# Patient Record
Sex: Male | Born: 1935 | Race: White | Hispanic: No | Marital: Married | Smoking: Never smoker
Health system: Southern US, Community
[De-identification: ages and names within clinical notes are randomized; demographics above are authoritative.]

## PROBLEM LIST (undated history)

## (undated) DIAGNOSIS — I639 Cerebral infarction, unspecified: Secondary | ICD-10-CM

## (undated) DIAGNOSIS — K219 Gastro-esophageal reflux disease without esophagitis: Secondary | ICD-10-CM

## (undated) DIAGNOSIS — E785 Hyperlipidemia, unspecified: Secondary | ICD-10-CM

## (undated) DIAGNOSIS — I2510 Atherosclerotic heart disease of native coronary artery without angina pectoris: Secondary | ICD-10-CM

## (undated) DIAGNOSIS — E119 Type 2 diabetes mellitus without complications: Secondary | ICD-10-CM

## (undated) DIAGNOSIS — G459 Transient cerebral ischemic attack, unspecified: Secondary | ICD-10-CM

## (undated) DIAGNOSIS — I219 Acute myocardial infarction, unspecified: Secondary | ICD-10-CM

## (undated) DIAGNOSIS — N419 Inflammatory disease of prostate, unspecified: Secondary | ICD-10-CM

## (undated) DIAGNOSIS — R471 Dysarthria and anarthria: Secondary | ICD-10-CM

## (undated) DIAGNOSIS — M2550 Pain in unspecified joint: Secondary | ICD-10-CM

## (undated) DIAGNOSIS — C4491 Basal cell carcinoma of skin, unspecified: Secondary | ICD-10-CM

## (undated) DIAGNOSIS — I10 Essential (primary) hypertension: Secondary | ICD-10-CM

## (undated) DIAGNOSIS — M4800 Spinal stenosis, site unspecified: Secondary | ICD-10-CM

## (undated) DIAGNOSIS — N529 Male erectile dysfunction, unspecified: Secondary | ICD-10-CM

## (undated) HISTORY — DX: Transient cerebral ischemic attack, unspecified: G45.9

## (undated) HISTORY — DX: Essential (primary) hypertension: I10

## (undated) HISTORY — DX: Basal cell carcinoma of skin, unspecified: C44.91

## (undated) HISTORY — DX: Inflammatory disease of prostate, unspecified: N41.9

## (undated) HISTORY — PX: LUMBAR LAMINECTOMY: SHX95

## (undated) HISTORY — DX: Acute myocardial infarction, unspecified: I21.9

## (undated) HISTORY — DX: Type 2 diabetes mellitus without complications: E11.9

## (undated) HISTORY — DX: Gastro-esophageal reflux disease without esophagitis: K21.9

## (undated) HISTORY — DX: Atherosclerotic heart disease of native coronary artery without angina pectoris: I25.10

## (undated) HISTORY — DX: Spinal stenosis, site unspecified: M48.00

## (undated) HISTORY — PX: KNEE SURGERY: SHX244

## (undated) HISTORY — DX: Pain in unspecified joint: M25.50

## (undated) HISTORY — DX: Hyperlipidemia, unspecified: E78.5

## (undated) HISTORY — DX: Male erectile dysfunction, unspecified: N52.9

## (undated) HISTORY — DX: Cerebral infarction, unspecified: I63.9

---

## 2005-09-12 HISTORY — PX: CORONARY ANGIOPLASTY WITH STENT PLACEMENT: SHX49

## 2011-01-11 DIAGNOSIS — I219 Acute myocardial infarction, unspecified: Secondary | ICD-10-CM

## 2011-01-11 HISTORY — DX: Acute myocardial infarction, unspecified: I21.9

## 2012-12-04 ENCOUNTER — Ambulatory Visit: Payer: Self-pay | Admitting: Oncology

## 2012-12-04 LAB — CBC CANCER CENTER
Basophil #: 0.1 x10 3/mm (ref 0.0–0.1)
Basophil %: 1.7 %
Eosinophil #: 0.1 x10 3/mm (ref 0.0–0.7)
HGB: 12.1 g/dL — ABNORMAL LOW (ref 13.0–18.0)
Lymphocyte #: 1.4 x10 3/mm (ref 1.0–3.6)
Lymphocyte %: 16.4 %
MCH: 31.1 pg (ref 26.0–34.0)
MCHC: 35.7 g/dL (ref 32.0–36.0)
MCV: 87 fL (ref 80–100)
Monocyte #: 0.7 x10 3/mm (ref 0.2–1.0)
Monocyte %: 8 %
Neutrophil #: 6.1 x10 3/mm (ref 1.4–6.5)
Neutrophil %: 72.7 %
RDW: 14.4 % (ref 11.5–14.5)

## 2012-12-04 LAB — LACTATE DEHYDROGENASE: LDH: 175 U/L (ref 85–241)

## 2012-12-11 ENCOUNTER — Ambulatory Visit: Payer: Self-pay | Admitting: Oncology

## 2013-06-24 ENCOUNTER — Inpatient Hospital Stay: Payer: Self-pay | Admitting: Internal Medicine

## 2013-06-24 LAB — BASIC METABOLIC PANEL
Calcium, Total: 9.9 mg/dL (ref 8.5–10.1)
Co2: 24 mmol/L (ref 21–32)
Creatinine: 1.45 mg/dL — ABNORMAL HIGH (ref 0.60–1.30)
EGFR (African American): 52 — ABNORMAL LOW
EGFR (Non-African Amer.): 45 — ABNORMAL LOW
Glucose: 226 mg/dL — ABNORMAL HIGH (ref 65–99)
Potassium: 3.6 mmol/L (ref 3.5–5.1)
Sodium: 141 mmol/L (ref 136–145)

## 2013-06-24 LAB — CBC WITH DIFFERENTIAL/PLATELET
Basophil #: 0.1 10*3/uL (ref 0.0–0.1)
Basophil %: 0.6 %
Eosinophil %: 1.5 %
HCT: 38.2 % — ABNORMAL LOW (ref 40.0–52.0)
HGB: 13.6 g/dL (ref 13.0–18.0)
Lymphocyte %: 13.3 %
MCHC: 35.5 g/dL (ref 32.0–36.0)
MCV: 88 fL (ref 80–100)
Monocyte #: 1.1 x10 3/mm — ABNORMAL HIGH (ref 0.2–1.0)
Monocyte %: 8.7 %
Neutrophil #: 9.8 10*3/uL — ABNORMAL HIGH (ref 1.4–6.5)
Neutrophil %: 75.9 %
RDW: 14.7 % — ABNORMAL HIGH (ref 11.5–14.5)
WBC: 12.9 10*3/uL — ABNORMAL HIGH (ref 3.8–10.6)

## 2013-06-24 LAB — PROTIME-INR: Prothrombin Time: 15.7 secs — ABNORMAL HIGH (ref 11.5–14.7)

## 2013-06-24 LAB — DIGOXIN LEVEL: Digoxin: 0.1 ng/mL

## 2013-06-25 LAB — URINALYSIS, COMPLETE
Hyaline Cast: 3
Ketone: NEGATIVE
Leukocyte Esterase: NEGATIVE
Nitrite: NEGATIVE
Ph: 5 (ref 4.5–8.0)
Specific Gravity: 1.013 (ref 1.003–1.030)
Squamous Epithelial: NONE SEEN
WBC UR: <1 /HPF (ref 0–5)

## 2013-09-27 ENCOUNTER — Ambulatory Visit: Payer: Self-pay | Admitting: Nurse Practitioner

## 2013-10-17 ENCOUNTER — Encounter: Payer: Self-pay | Admitting: Podiatry

## 2013-10-25 ENCOUNTER — Encounter: Payer: Self-pay | Admitting: Podiatry

## 2013-10-25 ENCOUNTER — Ambulatory Visit (INDEPENDENT_AMBULATORY_CARE_PROVIDER_SITE_OTHER): Payer: Medicare Other | Admitting: Podiatry

## 2013-10-25 VITALS — BP 139/65 | HR 61 | Resp 16 | Ht 73.0 in | Wt 180.0 lb

## 2013-10-25 DIAGNOSIS — B351 Tinea unguium: Secondary | ICD-10-CM

## 2013-10-25 DIAGNOSIS — M2040 Other hammer toe(s) (acquired), unspecified foot: Secondary | ICD-10-CM

## 2013-10-25 DIAGNOSIS — M79609 Pain in unspecified limb: Secondary | ICD-10-CM

## 2013-10-25 NOTE — Progress Notes (Signed)
Subjective:     Patient ID: Robert Rojas, male   DOB: [birth year], 81 y.o.   MRN: [id number]  HPI patient presents with caregiver stating he has had strokes and he is developed hammertoes and painful nailbeds due to the position of his toes in general. States this has been getting worse over the last couple years and he cannot cut them   Review of Systems  All other systems reviewed and are negative.       Objective:   Physical Exam  Nursing note and vitals reviewed. Constitutional: He is oriented to person, place, and time.  Neurological: He is oriented to person, place, and time.  Skin: Skin is dry.   I noted that the patient's pulses are reduced +1/4 both DP and PT but I didn't note normal Fill time and warmness to his feet. He's got digital contracture lesser digits right over left foot with incurvation of the nailbeds but no keratotic lesions or indications of ulcerations. Neurological was diminished range of motion is diminished of the subtalar and midtarsal joint with contracture of the right foot secondary to stroke     Assessment:     Painful mycotic nail infection secondary to digital position with hammertoe deformity secondary to stroke    Plan:     H&P and education rendered to patient. Debridement nailbeds 1-5 both feet which will be done routinely and he'll be seen back prior if any redness or irritation were to occur within his toes her feet

## 2013-10-25 NOTE — Progress Notes (Signed)
   Subjective:    Patient ID: Robert Rojas, male    DOB: [birth year], 80 y.o.   MRN: [id number]  HPI Comments: He needs his toenails trimmed and his toes are curling under his right foot. My toes and toenails do not hurt. Pt was trimming his toenails but not able to trim them now.      Review of Systems  Genitourinary:       Frequency  Musculoskeletal:       Difficulty walking  All other systems reviewed and are negative.       Objective:   Physical Exam        Assessment & Plan:

## 2013-12-04 ENCOUNTER — Ambulatory Visit: Payer: Self-pay | Admitting: Nurse Practitioner

## 2014-01-24 ENCOUNTER — Ambulatory Visit: Payer: Medicare Other | Admitting: Podiatry

## 2014-01-28 ENCOUNTER — Encounter: Payer: Self-pay | Admitting: Podiatry

## 2014-01-28 ENCOUNTER — Ambulatory Visit (INDEPENDENT_AMBULATORY_CARE_PROVIDER_SITE_OTHER): Payer: Medicare Other | Admitting: Podiatry

## 2014-01-28 VITALS — BP 147/72 | HR 82 | Resp 16

## 2014-01-28 DIAGNOSIS — M79609 Pain in unspecified limb: Secondary | ICD-10-CM

## 2014-01-28 DIAGNOSIS — B351 Tinea unguium: Secondary | ICD-10-CM

## 2014-01-28 NOTE — Progress Notes (Signed)
Subjective:     Patient ID: Robert Rojas, male   DOB: 15-Dec-1929, 80 y.o.   MRN: [id number]  HPI patient presents with thick nailbeds 1-5 both feet better yellow and he cannot take care of them himself and they're painful  Review of Systems     Objective:   Physical Exam Thick brittle nailbeds 1-5 both feet with ingrown corners and pain    Assessment:     Mycotic nail infection with pain 1-5 both feet nails    Plan:     Debridement painful nailbeds 1-5 both feet with no iatrogenic bleeding noted

## 2014-04-29 ENCOUNTER — Ambulatory Visit: Payer: Medicare Other

## 2015-01-02 NOTE — H&P (Signed)
PATIENT NAME:  Robert Rojas, Robert Rojas MR#:  [id number] DATE OF BIRTH:  09-26-1929  PRIMARY CARE PHYSICIAN:  Dorisann Frames, MD.  CHIEF COMPLAINT: Slurred speech and weakness.  HISTORY OF PRESENT ILLNESS: This is an 81 year old male who presents to the hospital as a direct admission from Dr. Bailey Mech office due to slurred speech and weakness. The patient apparently has a previous history of CVA and normally had some mild amount of slurred speech  but has significantly improved. Over the past 2 days, his speech has been significantly more slurred and he has had generalized weakness, when his wife says that she has not been able to get him out of bed since this morning. She also has to help him over the weekend with going to the bathroom when normally he is able to get around with a cane.   He went to see his primary care physician who then referred him to the hospital for further evaluation. The patient denies any headache, any numbness, tingling, any focal weakness anywhere, any nausea, vomiting, abdominal pain, chest pain, shortness of breath or any other associated symptoms.   REVIEW OF SYSTEMS:  CONSTITUTIONAL: No documented fever. No weight gain or weight loss.  EYES: No blurred or double vision.  ENT: No tinnitus. No postnasal drip. No redness of the oropharynx.  RESPIRATORY: No cough. No wheeze, no hemoptysis, no dyspnea.  CARDIOVASCULAR: No chest pain, no orthopnea, no palpitations or syncope.  GASTROINTESTINAL: No nausea. No vomiting. No diarrhea. No abdominal pain. No melena or hematochezia.  GENITOURINARY: No dysuria. No hematuria.  ENDOCRINE: No polyuria or nocturia. No heat or cold intolerance.  HEMATOLOGIC: No anemia. No bruising. No bleeding.  INTEGUMENTARY: No rashes. No lesions.  MUSCULOSKELETAL: No arthritis. No swelling. No gout.  NEUROLOGIC: No numbness or tingling. No ataxia. No seizure-type activity.  PSYCHIATRIC: No anxiety. No insomnia. No ADD.   PAST MEDICAL HISTORY:   Consistent with history of chronic atrial fibrillation, hypertension, hyperlipidemia, diabetes, history of previous CVA.   ALLERGIES: No known drug allergies.   SOCIAL HISTORY: No smoking. No alcohol abuse. No illicit drug abuse. Lives at home with his wife.   FAMILY HISTORY: Both mother and father are deceased. Mother had diabetes. She died from old age. Father died from complications of heart disease.   CURRENT MEDICATIONS: Potassium 20 mEq daily, Cardizem CD 240 mg daily, lisinopril 2.5 mg daily, Coreg 6.25 mg b.i.d., Lasix 40 mg b.i.d., gemfibrozil 600 mg b.i.d., Lasix 40 mg  b.i.d., digoxin 250 mcg daily, aspirin 81 mg daily, Pravachol 40 mg daily, Pradaxa 150 mg b.i.d.,  Januvia 50 mg daily, and Lantus 100 units at bedtime.   PHYSICAL EXAMINATION:  VITAL SIGNS: Temperature 97.5, pulse 56, respirations 20, blood pressure 146/66, sats 96% on room air.  GENERAL: A pleasant -appearing male in no apparent distress.  HEENT: Atraumatic, normocephalic. Extraocular muscles are intact. Pupils equal, reactive to light. Sclerae anicteric. No conjunctival injection. No oropharyngeal erythema.  NECK: Supple. There is no jugular venous distention. No bruits, no lymphadenopathy, no thyromegaly.  HEART: Irregular. No murmurs. No rubs. No clicks.  LUNGS: Clear to auscultation bilaterally. No rales. No rhonchi, no wheezes.  ABDOMEN: Soft, flat, nontender, nondistended. Has good bowel sounds. No hepatosplenomegaly appreciated.  EXTREMITIES: No evidence of any cyanosis, clubbing, or peripheral edema. Has +2 pedal and radial pulses bilaterally.  NEUROLOGIC: Alert, awake, and oriented x3, globally weak; otherwise, no other focal motor or sensory deficits appreciated bilaterally. Babinski's are downgoing bilaterally. Finger-to-nose test is normal. No ataxia noted.  LABORATORY AND RADIOLOGICAL DATA: Still pending.   ASSESSMENT AND PLAN: This is an 81 year old male with a history of chronic atrial  fibrillation, history of previous cerebrovascular accident, hypertension, hyperlipidemia, diabetes, who presents to the hospital with slurred speech and generalized weakness, ongoing for the past two days.   1.  Slurred speech and worsening weakness. The patient's symptoms are suspicious for a possible acute cerebrovascular accident, but these symptoms started over 2 days ago; therefore, he has missed the window for thrombolytics. I will get a CT head STAT now. If it is negative, consider getting a MRI of the brain. Will get a carotid duplex and a 2-dimensional echocardiogram.  2.  Continue his Pradaxa. Will get a physical therapy and a speech evaluation. I will follow q.4 hour neuro checks and get a neurology consult.  3.  History of chronic atrial fibrillation. The patient is currently rate controlled. I will continue his digoxin, Cardizem, and Coreg and continue his Pradaxa for now.  4.  Hypertension, presently hemodynamically stable. Continue Coreg. Continue lisinopril.  5.  Diabetes. The patient will be maintained on his Januvia and sliding-scale insulin for now. Hold Lantus at this time.  6.  Hyperlipidemia. Continue Pravachol.  7.  History of congestive heart failure. The patient clinically does not appear to be in congestive heart failure. I will hold his Lasix for now, continue Coreg and continue lisinopril for now.   CODE STATUS: FULL CODE.   TIME SPENT ON ADMISSION: 50 minutes.   The patient will be transferred over to Dr. Bailey Mech service.    ____________________________ Belia Heman. Verdell Carmine, MD vjs:np D: 06/24/2013 15:32:00 ET T: 06/24/2013 15:44:09 ET JOB#: [id number]  cc: Belia Heman. Verdell Carmine, MD, <Dictator> Henreitta Leber MD ELECTRONICALLY SIGNED 06/25/2013 11:17

## 2015-01-02 NOTE — Discharge Summary (Signed)
PATIENT NAME:  Robert Rojas, Robert Rojas MR#:  [id number] DATE OF BIRTH:  1930-09-06  DATE OF ADMISSION:  06/24/2013 DATE OF DISCHARGE:  06/27/2013   ADMITTING PHYSICIAN: Vivek J. Verdell Carmine, MD   DISCHARGE DIAGNOSES:  1. Acute right paraventricular and lenticular nucleus stroke.  2. History of cerebrovascular disease.  3. History of diastolic congestive heart failure.  4. Atrial fibrillation.  5. Diabetes mellitus. 6. Hypertension.   CONSULTATION: Neurology, Doneta Public. Melrose Nakayama, MD.  PROCEDURES: None.   IMAGING:  1. Carotid Doppler ultrasound.  2. CT scan of the brain.  3. MRI of the brain. MRI of the brain confirmed stroke with infarct in above-mentioned areas.   DISCHARGE MEDICATIONS:  1. Aspirin 81 mg daily.  2. Carvedilol 6.25 mg b.i.d. 3. Diltiazem 240 mg daily.  4. Gemfibrozil 600 mg b.i.d.  5. Potassium 20 mEq daily.  6. Pradaxa 150 mg b.i.d. 7. Pravastatin 40 mg at bedtime.  8. Lisinopril 2.5 mg daily.  9. Furosemide 40 mg b.i.d.  10. Januvia 50 mg once a day.  11. Lantus 20 units subcutaneous at bedtime.  12. Digoxin 250 mcg daily.   HOSPITAL COURSE: This gentleman was admitted from his 13 office, where he presented with worsening slurred speech and inability to rise out of his wheelchair, concerning for an acute stroke. He was admitted through the Emergency Room. CT scan of the brain was initially negative for an acute stroke; however, MRI performed the following day confirmed acute right periventricular infarct. A neurology consultation was placed with Dr. Melrose Nakayama, whose clinical impression concurred with that. Due to the fact that he was already on 2 antiplatelet agents, there was no additional therapy that could be added. The patient'Robert Rojas speech gradually improved during his stay. He was evaluated by speech therapy, and his diet was modified appropriately. The patient'Robert Rojas carotid ultrasound was negative for any significant obstruction. He underwent physical therapy with rehab and  was scheduled to be discharged to a skilled nursing facility.   DISCHARGE INSTRUCTIONS: 1. Diet: Low sodium, low fat, ADA diet.  2. Activity: Continue physical therapy as tolerated.  3. Follow up in 2 to 4 weeks after discharge from skilled nursing facility following rehabilitation.  ____________________________ Venetia Maxon Elijio Miles, MD sat:lb D: 06/27/2013 12:53:16 ET T: 06/27/2013 13:36:00 ET JOB#: [id number]  cc: Sheikh A. Elijio Miles, MD, <Dictator> Veverly Fells MD ELECTRONICALLY SIGNED 07/05/2013 13:48

## 2015-01-02 NOTE — Consult Note (Signed)
Referring Physician:  Veverly Fells   Primary Care Physician:  Selinda Michaels, [address], [place], [place] [address], [place]  Reason for Consult: Admit Date: 24-Jun-2013  Chief Complaint: slurred speech  Reason for Consult: slurred speech   History of Present Illness: History of Present Illness:   81 year old man with history of stroke with subsequent slurred speech, presents from clinic because of worsening slurred speech and concern for new stroke.  Normally patients dysarthria is mild.  However, over the past two days or so his speech has become more and more slurred to the point that it has become difficult to understand him.  Also some complaint of mild generalized weakness as well.  No new focal weakness in arm or leg.  No facial droop.  Symptoms constant.  Moderate severity.  Nothing makes them better or worse.   MEDICAL HISTORY: atrial fibrillation, of previous CVA.  SURGICAL HISTORY:replacement. HISTORY: smoking.alcohol abuse. illicit drug abuse. at home with his wife.  HISTORY: mother and father are deceased. had diabetes. She died from old age. died from complications of heart disease.  MEDICATIONS: 20 mEq daily, CD 240 mg daily,2.5 mg daily 6.25 mg b.i.d,40 mg b.i.d, 600 mg b.i.d,40 mg  b.i.d 250 mcg daily 81 mg daily 40 mg daily 150 mg b.i.d.  50 mg daily Lantus 100 units at bedtime.   ALLERGIES: No known drug allergies.   ROS:  General weakness   HEENT no complaints   Lungs no complaints   Cardiac no complaints   GI no complaints   GU no complaints   Musculoskeletal no complaints   Extremities no complaints   Skin no complaints   Endocrine no complaints   Psych no complaints   Past Medical/Surgical Hx:  Hear failure:   Diabetes Type 2:   Cardiac Stents:   Irregular heart beat: made aware after his heart attack  TIA's: several over the years  CVA: 2012  Myocardial Infarction: History of 2 heart attacks,  1989 and 2011  Back surgery:   Left knee replacement:   Home Medications: Medication Instructions Last Modified Date/Time  Aspir 81 81 mg oral tablet 1 tab(s) orally once a day 13-Oct-14 16:34  carvedilol 6.25 mg oral tablet 1 tab(s) orally 2 times a day 13-Oct-14 16:34  diltiazem 24 hour extended release 240 mg/24 hours oral capsule, extended release 1 cap(s) orally once a day 13-Oct-14 16:34  gemfibrozil 600 mg oral tablet 1 tab(s) orally 2 times a day 13-Oct-14 16:34  Janumet 1000 mg-50 mg oral tablet 1 tab(s) orally 2 times a day 13-Oct-14 16:34  Klor-Con M20 20 mEq oral tablet, extended release 1 tab(s) orally once a day 13-Oct-14 16:34  Pradaxa 150 mg oral capsule 1 cap(s) orally 2 times a day 13-Oct-14 16:34  pravastatin 40 mg oral tablet 1 tab(s) orally once a day (at bedtime) 13-Oct-14 16:34  lisinopril 2.5 mg oral tablet 1 tab(s) orally once a day 13-Oct-14 16:34  furosemide 40 mg oral tablet 40 milligram(s) orally 2 times a day 13-Oct-14 16:34  Januvia 50 mg oral tablet 1 tab(s) orally once a day 13-Oct-14 16:34  Lantus 100 units/mL subcutaneous solution 20  subcutaneous once a day (at bedtime) 13-Oct-14 16:34   [facility] Current Meds:  Acetaminophen * tablet, ( Tylenol (325 mg) tablet)  650 mg Oral q4h PRN for pain or temp. greater than 100.4  - Indication: Pain/Fever  Diltiazem ER capsule (24HR),  ( Cardizem CD)  240 mg Oral daily  - Indication: Angina/ Hypertension  Instructions:  - DO NOT CRUSH  Ondansetron injection, ( Zofran injection )  4 mg, IV push, q4h PRN for Nausea/Vomiting  Indication: Nausea/ Vomiting  Carvedilol tablet,  ( Coreg)  6.25 mg Oral bid  - Indication: CHF/ Hypertension  Digoxin tablet, ( Lanoxin)  0.25 mg Oral daily  - Indication: Heart Failure/ Atrial Fibrillation and Flutter/ Paroxysmal Atrial Tachycardia  Lisinopril tablet, ( Zestril)  2.5 mg Oral daily  - Indication: Hypertension/ CHF  Dabigatran capsule,  ( pRADAxa capsule)  150 mg Oral  bid  - Indication: stroke prevention in non-valvular a-fib, Monitor Anticoags per hospital protocol  Instructions:  DO NOT CRUSH  Insulin SS -Novolog injection, Subcutaneous, FSBS before meals and at bedtime  give no insulin if FSBS 0 - 150     2 unit(s) if FSBS 151 - 200     4 unit(s) if FSBS 201 - 250     6 unit(s) if FSBS 251 - 300     8 unit(s) if FSBS 301 - 350     10 unit(s) if FSBS 351 - 400  Call MD if Blood Glucose greater than 400, [Waste Code: Black]  Pravastatin tablet, ( Pravachol)  40 mg Oral at bedtime  - Indication: Hypercholesterolemia  sitaGLIPtin tablet,  ( Januvia)  50 mg Oral daily  - Indication: antidiabetic agent  Influenza Virus Quadrivalent Vaccine injection, 0.5 ml, Intramuscular, once  Indication: provide Active Immunity to Influenza Strains contained in Vaccine, ***The patient must have a temperature of 100.5 or less, anything greater the patient needs to be afebrile x 24 hours before administration***, **Latex Free**  Nursing Saline Flush, 3 to 6 ml, IV push, Q1M PRN for IV Maintenance  Nursing Saline Flush, 3 to 6 ml, IV push, Q1M PRN for IV Maintenance  Allergies:  No Known Allergies:   Vital Signs: **Vital Signs.:   14-Oct-14 05:57  Vital Signs Type Routine  Temperature Temperature (F) 98.5  Celsius 36.9  Temperature Source oral  Pulse Pulse 79  Respirations Respirations 19  Systolic BP Systolic BP 854  Diastolic BP (mmHg) Diastolic BP (mmHg) 66  Mean BP 87  Pulse Ox % Pulse Ox % 96  Pulse Ox Activity Level  At rest  Oxygen Delivery Room Air/ 21 %   EXAM: PHYSICAL EXAMINATION  GENERAL: Pleasant and interactive.  NAD.  Normocephalic and atraumatic.  EYES: Funduscopic exam shows normal disc size, appearance and C/D ratio without clear evidence of papilledema.  CARDIOVASCULAR: S1 and S2 sounds are within normal limits, without murmurs, gallops, or rubs.  MUSCULOSKELETAL: Bulk - Normal Tone - Normal Pronator Drift - Absent  bilaterally. Ambulation - Deferred, on fall precautions over concern for stroke.  R/L 5/4    Shoulder abduction (deltoid/supraspinatus, axillary/suprascapular n, C5) 5/4    Elbow flexion (biceps brachii, musculoskeletal n, C5-6) 5/4    Elbow extension (triceps, radial n, C7) 5/4    Finger adduction (interossei, ulnar n, T1)   5/4    Hip flexion (iliopsoas, L1/L2) 5/4    Knee flexion (hamstrings, sciatic n, L5/S1) 5/4    Knee extension (quadriceps, femoral n, L3/4) 5/4    Ankle dorsiflexion (tibialis anterior, deep fibular n, L4/5) 5/4    Ankle plantarflexion (gastroc, tibial n, S1)  NEUROLOGICAL: MENTAL STATUS: Patient is oriented to person, place and time.  Recent and remote memory are intact.  Attention span and concentration are intact.  Naming, repetition, comprehension and expressive speech are within  normal limits.  Moderate to severe dysarthria is noted, however no aphasia.  Patient's fund of knowledge is within normal limits for educational level.  CRANIAL NERVES: Normal    CN II (normal visual acuity and visual fields) Normal    CN III, IV, VI (extraocular muscles are intact) Normal    CN V (facial sensation is intact bilaterally) Normal    CN VII (facial strength is intact bilaterally) Normal    CN VIII (hearing is intact bilaterally) Abnormal    CN IX/X (palate elevates midline, dysarthric speech) Normal    CN XI (shoulder shrug strength is normal and symmetric) Normal    CN XII (tongue protrudes midline)   SENSATION: Intact to pain and temp bilaterally (spinothalamic tracts) Intact to position and vibration bilaterally (dorsal columns)   REFLEXES: R/L 2+/2+    Biceps 2+/2+    Brachioradialis   2+/2+    Patellar 2+/2+    Achilles   COORDINATION/CEREBELLAR: Finger to nose testing is within normal limits.  Lab Results:  LabObservation:  13-Oct-14 21:26   OBSERVATION Reason for Test  TDMs:  13-Oct-14 16:32   Digoxin, Serum 0.10 (Therapeutic range for digoxin  in patients with atrial fibrillation: 0.8 - 2.0 ng/mL. In patients with congestive heart failure a therapeutic range of 0.5 - 0.8 ng/mL is suggested as higher levels are associated with an increased risk of toxicity without clear evidence of enhanced efficacy. Digoxin toxicity is commonly associated with serum levels > 2.0 ng/mL but may occur with lower levels, including those in the therapeutic range. Blood samples should be obtained 6-8 hours after administration to assure a reasonable volume of distribution.)  Routine Chem:  13-Oct-14 16:32   Glucose, Serum  226  BUN  31  Creatinine (comp)  1.45  Sodium, Serum 141  Potassium, Serum 3.6  Chloride, Serum 105  CO2, Serum 24  Calcium (Total), Serum 9.9  Anion Gap 12  Osmolality (calc) 295  eGFR (African American)  52  eGFR (Non-African American)  45 (eGFR values <43m/min/1.73 m2 may be an indication of chronic kidney disease (CKD). Calculated eGFR is useful in patients with stable renal function. The eGFR calculation will not be reliable in acutely ill patients when serum creatinine is changing rapidly. It is not useful in  patients on dialysis. The eGFR calculation may not be applicable to patients at the low and high extremes of body sizes, pregnant women, and vegetarians.)  Routine UA:  14-Oct-14 02:28   Color (UA) Yellow  Clarity (UA) Clear  Glucose (UA) >=500  Bilirubin (UA) Negative  Ketones (UA) Negative  Specific Gravity (UA) 1.013  Blood (UA) Negative  pH (UA) 5.0  Protein (UA) 30 mg/dL  Nitrite (UA) Negative  Leukocyte Esterase (UA) Negative (Result(s) reported on 25 Jun 2013 at 03:26AM.)  RBC (UA) 1 /HPF  WBC (UA) <1 /HPF  Bacteria (UA) NONE SEEN  Epithelial Cells (UA) NONE SEEN  Hyaline Cast (UA) 3 /LPF (Result(s) reported on 25 Jun 2013 at 03:26AM.)  Routine Coag:  13-Oct-14 16:32   Prothrombin  15.7  INR 1.2 (INR reference interval applies to patients on anticoagulant therapy. A single INR  therapeutic range for coumarins is not optimal for all indications; however, the suggested range for most indications is 2.0 - 3.0. Exceptions to the INR Reference Range may include: Prosthetic heart valves, acute myocardial infarction, prevention of myocardial infarction, and combinations of aspirin and anticoagulant. The need for a higher or lower target INR must be assessed individually. Reference: The Pharmacology  and Management of the Vitamin K  antagonists: the seventh ACCP Conference on Antithrombotic and Thrombolytic Therapy. OIBBC.4888 Sept:126 (3suppl): [id number]. A HCT value >55% may artifactually increase the PT.  In one study,  the increase was an average of 25%. Reference:  "Effect on Routine and Special Coagulation Testing Values of Citrate Anticoagulant Adjustment in Patients with High HCT Values." American Journal of Clinical Pathology 2006;126:400-405.)  Routine Hem:  13-Oct-14 16:32   WBC (CBC)  12.9  RBC (CBC)  4.35  Hemoglobin (CBC) 13.6  Hematocrit (CBC)  38.2  Platelet Count (CBC) 238  MCV 88  MCH 31.2  MCHC 35.5  RDW  14.7  Neutrophil % 75.9  Lymphocyte % 13.3  Monocyte % 8.7  Eosinophil % 1.5  Basophil % 0.6  Neutrophil #  9.8  Lymphocyte # 1.7  Monocyte #  1.1  Eosinophil # 0.2  Basophil # 0.1 (Result(s) reported on 24 Jun 2013 at 05:07PM.)   Radiology Results: [place]:    13-Oct-14 17:34, US Carotid Doppler Bilateral  US Carotid Doppler Bilateral   REASON FOR EXAM:    Acute CVA/TIA  COMMENTS:       PROCEDURE: [place]  - US CAROTID DOPPLER BILATERAL  - Jun 24 2013  5:34PM     RESULT: History: CVA.     Comparison study: Head CT February 22, 2013.    Findings: Bilateral carotid color duplex Doppler revealsmild plaque both   carotid bifurcations. Peak systolic flow velocity ratio on the right is   1.1 , on the left 1.0. No hemodynamically significant stenosis.   Vertebrals are patent with antegrade flow.    IMPRESSION:  No hemodynamically significant  stenosis.    Verified By: Osa Craver, M.D., MD  CT:    13-Oct-14 16:54, CT Head Without Contrast  CT Head Without Contrast   REASON FOR EXAM:    AMS/Slurred Speech  COMMENTS:       PROCEDURE: CT  - CT HEAD WITHOUT CONTRAST  - Jun 24 2013  4:54PM     RESULT: History: Slurred speech.    Comparison Study: No prior.    Findings: Standard nonenhanced CT obtained. No mass or hydrocephalus.   White matter changes noted consistent with chronic ischemia. Old lacunar   infarction noted in the basal ganglia. Orbits are unremarkable. Mastoids   and paranasal sinuses are clear.    IMPRESSION:  Chronic ischemic change.    Verified By: Osa Craver, M.D., MD   Impression/Recommendations: Recommendations:   81 year old man with history of stroke with subsequent slurred speech, presents from clinic because of worsening slurred speech and concern for new stroke.  recently spoke with patient in clinic, dysarthria significantly worse, suspect acute CVA.  Rest of neuro exam unchanged per patient and wife.  Current HCT negative.  Patient needs Brain MRI if medically tolerated and full stroke workup.  Carotid ultrasound negative.  Continue his outpatient regimen of pradaxa and baby aspirin given likely ischemic event despite this aggressive regimen.  Allow permissive HTN up to SBP 220.  If unable to get Brain MRi for any reason, then repeat HCT in 48 hours from initial, so likely Wedns afternoon, but once again only if cannot get MRI.   HTNpradaxa and ASAultrasound - wnl.to rule out afib, patient known to have "irregular heart beat" (? afib)to rule out intracardiac thrombus.therapy consult.against smoking. have reviewed the results of the most recent imaging studies, tests and labs as outlined above and answered all related questions.  and  coordinated plan of care with hospitalist.       Electronic Signatures: Anabel Bene (MD)  (Signed 15-Oct-14 01:16)  Authored: REFERRING  PHYSICIAN, Primary Care Physician, Consult, History of Present Illness, Review of Systems, PAST MEDICAL/SURGICAL HISTORY, HOME MEDICATIONS, Current Medications, ALLERGIES, NURSING VITAL SIGNS, Physical Exam-, LAB RESULTS, RADIOLOGY RESULTS, Recommendations   Last Updated: 15-Oct-14 01:16 by Anabel Bene (MD)

## 2015-03-25 ENCOUNTER — Encounter: Payer: Self-pay | Admitting: *Deleted

## 2015-03-30 ENCOUNTER — Telehealth: Payer: Self-pay | Admitting: Urology

## 2015-03-30 NOTE — Telephone Encounter (Signed)
FY!

## 2015-03-31 ENCOUNTER — Ambulatory Visit: Payer: Self-pay | Admitting: Urology

## 2015-04-28 ENCOUNTER — Encounter: Payer: Self-pay | Admitting: Urology

## 2015-04-28 ENCOUNTER — Ambulatory Visit (INDEPENDENT_AMBULATORY_CARE_PROVIDER_SITE_OTHER): Payer: Medicare Other | Admitting: Urology

## 2015-04-28 VITALS — BP 134/64 | HR 73 | Ht 73.0 in | Wt 196.5 lb

## 2015-04-28 DIAGNOSIS — N528 Other male erectile dysfunction: Secondary | ICD-10-CM | POA: Diagnosis not present

## 2015-04-28 DIAGNOSIS — N529 Male erectile dysfunction, unspecified: Secondary | ICD-10-CM

## 2015-04-28 NOTE — Progress Notes (Signed)
04/28/2015 11:44 AM   Robert Rojas 20-Aug-1930 [id number]  Referring provider: Perrin Maltese, MD [address] [place], [place] [address]  Chief Complaint  Patient presents with  . Erectile Dysfunction    vaccum erectaid device teaching    HPI: Robert Rojas is an 80 year old white male with erectile dysfunction who has purchased a vacuum erect aid device and would like some instruction on its use.  I explained to the patient that he needs to expand the penis ring over the bottom of the plastic cylinder.  He will need to use a water-based lubricant applied to the base of the penis, but a lot of plastic cylinder, and inside the tube to allow the penis to easily be drawn into the cylinder tube with suction is applied.  The penis is then inserted into the tube and the tube was pushed against the base of the penis and body to create a proper seal. I advised the patient shave to allow for greater seal. He is then to pump manual pump or have his wife assist him in as negative pressure develops the penis will be engorged with blood and erection is achieved. After the erection is achieved the elastic penis radius. The tube to the bottom of the shaft of the penis to maintain the erection. Then the plastic cylinder may be removed for intercourse. I reminded him and his wife that is very important to remove the penis draining after intercourse to prevent penile strangulation.   PMH: Past Medical History  Diagnosis Date  . Coronary artery disease   . Essential hypertension   . Hyperlipemia   . Type 2 diabetes mellitus   . GERD (gastroesophageal reflux disease)   . TIA (transient ischemic attack)   . BCC (basal cell carcinoma)   . Spinal stenosis   . MI (myocardial infarction) 01/2011  . CVA (cerebral infarction)     left sided  . Arthralgia     left knee/patella/tibia/fibula  . Prostatitis   . Erectile dysfunction     Surgical History: Past Surgical History  Procedure Laterality Date  .  Coronary angioplasty with stent placement  2007  . Lumbar laminectomy    . Knee surgery Left     Home Medications:    Medication List       This list is accurate as of: 04/28/15 11:44 AM.  Always use your most recent med list.               aspirin 81 MG tablet  Take 81 mg by mouth daily.     carvedilol 6.25 MG tablet  Commonly known as:  COREG     cetirizine 10 MG tablet  Commonly known as:  ZYRTEC  TAKE 1 TABLET BY MOUTH AT BEDTIME FOR NASAL DRIP     digoxin 0.25 MG tablet  Commonly known as:  LANOXIN  Take 0.25 mg by mouth daily.     diltiazem 180 MG 24 hr capsule  Commonly known as:  TIAZAC  Take by mouth.     diltiazem 240 MG 24 hr capsule  Commonly known as:  DILACOR XR  Take 240 mg by mouth daily.     diltiazem 240 MG 24 hr capsule  Commonly known as:  CARDIZEM CD     fluticasone 50 MCG/ACT nasal spray  Commonly known as:  FLONASE     furosemide 40 MG tablet  Commonly known as:  LASIX  Take 40 mg by mouth daily.     gemfibrozil  600 MG tablet  Commonly known as:  LOPID  Take 600 mg by mouth 2 (two) times daily before a meal.     HUMALOG KWIKPEN 100 UNIT/ML KiwkPen  Generic drug:  insulin lispro  USE AS DIRECTED FOR SLIDING SCALE FOUR TIMES DAILY. MAX OF 48 UNITS PER DAY     JANUVIA 100 MG tablet  Generic drug:  sitaGLIPtin  Take 100 mg by mouth daily.     KLOR-CON M20 20 MEQ tablet  Generic drug:  potassium chloride SA  Take 20 mEq by mouth daily.     LANTUS SOLOSTAR 100 UNIT/ML Solostar Pen  Generic drug:  Insulin Glargine     lisinopril 2.5 MG tablet  Commonly known as:  PRINIVIL,ZESTRIL  Take 2.5 mg by mouth daily.     NOVOLOG 100 UNIT/ML injection  Generic drug:  insulin aspart  Inject into the skin.     ONE TOUCH ULTRA MINI W/DEVICE Kit     ONE TOUCH ULTRA TEST test strip  Generic drug:  glucose blood     ONETOUCH DELICA LANCETS 15B Misc     Potassium 99 MG Tabs  Take by mouth.     PRADAXA 150 MG Caps capsule  Generic  drug:  dabigatran  Take 150 mg by mouth 2 (two) times daily.     pravastatin 40 MG tablet  Commonly known as:  PRAVACHOL  Take 40 mg by mouth daily.     sertraline 50 MG tablet  Commonly known as:  ZOLOFT        Allergies:  Allergies  Allergen Reactions  . Lipitor [Atorvastatin]   . Lisinopril     Family History: Family History  Problem Relation Age of Onset  . Kidney disease Neg Hx   . Prostate cancer Neg Hx     Social History:  reports that he has never smoked. He does not have any smokeless tobacco history on file. He reports that he does not drink alcohol or use illicit drugs.  ROS: UROLOGY Frequent Urination?: No Hard to postpone urination?: Yes Burning/pain with urination?: No Get up at night to urinate?: No Leakage of urine?: No Urine stream starts and stops?: No Trouble starting stream?: Yes Do you have to strain to urinate?: No Blood in urine?: No Urinary tract infection?: No Sexually transmitted disease?: No Injury to kidneys or bladder?: No Painful intercourse?: No Weak stream?: No Erection problems?: Yes Penile pain?: No  Gastrointestinal Nausea?: No Vomiting?: No Indigestion/heartburn?: No Diarrhea?: Yes Constipation?: No  Constitutional Fever: No Night sweats?: No Weight loss?: No Fatigue?: No  Skin Skin rash/lesions?: No Itching?: No  Eyes Blurred vision?: No Double vision?: No  Ears/Nose/Throat Sore throat?: No Sinus problems?: No  Hematologic/Lymphatic Swollen glands?: No Easy bruising?: No  Cardiovascular Leg swelling?: No Chest pain?: No  Respiratory Cough?: No Shortness of breath?: No  Endocrine Excessive thirst?: No  Musculoskeletal Back pain?: Yes Joint pain?: No  Neurological Headaches?: No Dizziness?: No  Psychologic Depression?: No Anxiety?: No  Physical Exam: BP 134/64 mmHg  Pulse 73  Ht $R'6\' 1"'ja$  (1.854 m)  Wt 196 lb 8 oz (89.132 kg)  BMI 25.93 kg/m2   Laboratory Data: Lab Results    Component Value Date   WBC 12.9* 06/24/2013   HGB 13.6 06/24/2013   HCT 38.2* 06/24/2013   MCV 88 06/24/2013   PLT 238 06/24/2013    Lab Results  Component Value Date   CREATININE 1.45* 06/24/2013    Urinalysis    Component Value Date/Time  COLORURINE Yellow 06/25/2013 0228   APPEARANCEUR Clear 06/25/2013 0228   LABSPEC 1.013 06/25/2013 0228   PHURINE 5.0 06/25/2013 0228   GLUCOSEU >=500 06/25/2013 0228   HGBUR Negative 06/25/2013 0228   BILIRUBINUR Negative 06/25/2013 0228   KETONESUR Negative 06/25/2013 0228   PROTEINUR 30 mg/dL 06/25/2013 0228   NITRITE Negative 06/25/2013 0228   LEUKOCYTESUR Negative 06/25/2013 0228    Assessment & Plan:    1. Erectile dysfunction:   Patient and his wife are given instruction on how to use his vacuum erectile device properly. They will contact our office if they should have any more questions or experience any difficulty using the device.  There are no diagnoses linked to this encounter.  No Follow-up on file.  Zara Council, [facility] [address] [place], [place] [address] [phone]

## 2015-05-05 DIAGNOSIS — N529 Male erectile dysfunction, unspecified: Secondary | ICD-10-CM | POA: Insufficient documentation

## 2015-07-27 ENCOUNTER — Emergency Department: Payer: Medicare Other

## 2015-07-27 ENCOUNTER — Encounter: Payer: Self-pay | Admitting: *Deleted

## 2015-07-27 ENCOUNTER — Inpatient Hospital Stay
Admission: EM | Admit: 2015-07-27 | Discharge: 2015-07-31 | DRG: 281 | Disposition: A | Discharge status: Skilled Nursing Facility | Payer: Medicare Other | Attending: Internal Medicine | Admitting: Internal Medicine

## 2015-07-27 DIAGNOSIS — I493 Ventricular premature depolarization: Secondary | ICD-10-CM | POA: Diagnosis present

## 2015-07-27 DIAGNOSIS — Z79899 Other long term (current) drug therapy: Secondary | ICD-10-CM

## 2015-07-27 DIAGNOSIS — I429 Cardiomyopathy, unspecified: Secondary | ICD-10-CM | POA: Diagnosis present

## 2015-07-27 DIAGNOSIS — Z85828 Personal history of other malignant neoplasm of skin: Secondary | ICD-10-CM

## 2015-07-27 DIAGNOSIS — B9620 Unspecified Escherichia coli [E. coli] as the cause of diseases classified elsewhere: Secondary | ICD-10-CM | POA: Diagnosis present

## 2015-07-27 DIAGNOSIS — Z7901 Long term (current) use of anticoagulants: Secondary | ICD-10-CM

## 2015-07-27 DIAGNOSIS — R55 Syncope and collapse: Secondary | ICD-10-CM | POA: Diagnosis present

## 2015-07-27 DIAGNOSIS — J209 Acute bronchitis, unspecified: Secondary | ICD-10-CM | POA: Diagnosis present

## 2015-07-27 DIAGNOSIS — R7881 Bacteremia: Secondary | ICD-10-CM

## 2015-07-27 DIAGNOSIS — F0390 Unspecified dementia without behavioral disturbance: Secondary | ICD-10-CM | POA: Diagnosis present

## 2015-07-27 DIAGNOSIS — I252 Old myocardial infarction: Secondary | ICD-10-CM

## 2015-07-27 DIAGNOSIS — K219 Gastro-esophageal reflux disease without esophagitis: Secondary | ICD-10-CM | POA: Diagnosis present

## 2015-07-27 DIAGNOSIS — I2510 Atherosclerotic heart disease of native coronary artery without angina pectoris: Secondary | ICD-10-CM | POA: Diagnosis present

## 2015-07-27 DIAGNOSIS — M4800 Spinal stenosis, site unspecified: Secondary | ICD-10-CM | POA: Diagnosis present

## 2015-07-27 DIAGNOSIS — E785 Hyperlipidemia, unspecified: Secondary | ICD-10-CM | POA: Diagnosis present

## 2015-07-27 DIAGNOSIS — N390 Urinary tract infection, site not specified: Secondary | ICD-10-CM | POA: Diagnosis present

## 2015-07-27 DIAGNOSIS — I482 Chronic atrial fibrillation: Secondary | ICD-10-CM | POA: Diagnosis present

## 2015-07-27 DIAGNOSIS — I10 Essential (primary) hypertension: Secondary | ICD-10-CM | POA: Diagnosis present

## 2015-07-27 DIAGNOSIS — I214 Non-ST elevation (NSTEMI) myocardial infarction: Principal | ICD-10-CM | POA: Diagnosis present

## 2015-07-27 DIAGNOSIS — R509 Fever, unspecified: Secondary | ICD-10-CM

## 2015-07-27 DIAGNOSIS — Z955 Presence of coronary angioplasty implant and graft: Secondary | ICD-10-CM

## 2015-07-27 DIAGNOSIS — Z23 Encounter for immunization: Secondary | ICD-10-CM

## 2015-07-27 DIAGNOSIS — Z66 Do not resuscitate: Secondary | ICD-10-CM | POA: Diagnosis present

## 2015-07-27 DIAGNOSIS — Z794 Long term (current) use of insulin: Secondary | ICD-10-CM

## 2015-07-27 DIAGNOSIS — R4781 Slurred speech: Secondary | ICD-10-CM | POA: Diagnosis present

## 2015-07-27 DIAGNOSIS — I69398 Other sequelae of cerebral infarction: Secondary | ICD-10-CM

## 2015-07-27 DIAGNOSIS — Z7982 Long term (current) use of aspirin: Secondary | ICD-10-CM

## 2015-07-27 IMAGING — CR DG CHEST 2V
1 series · 2 of 2 positions shown · non-contrast
Comparison: Chest radiograph performed [DATE]

CLINICAL DATA: Status post syncope.  Weakness.  Initial encounter.

EXAM:
CHEST  2 VIEW

[Series 2: w chest lat · 0.14mm/px · 2 of 2 slices shown]
[im 1/2]
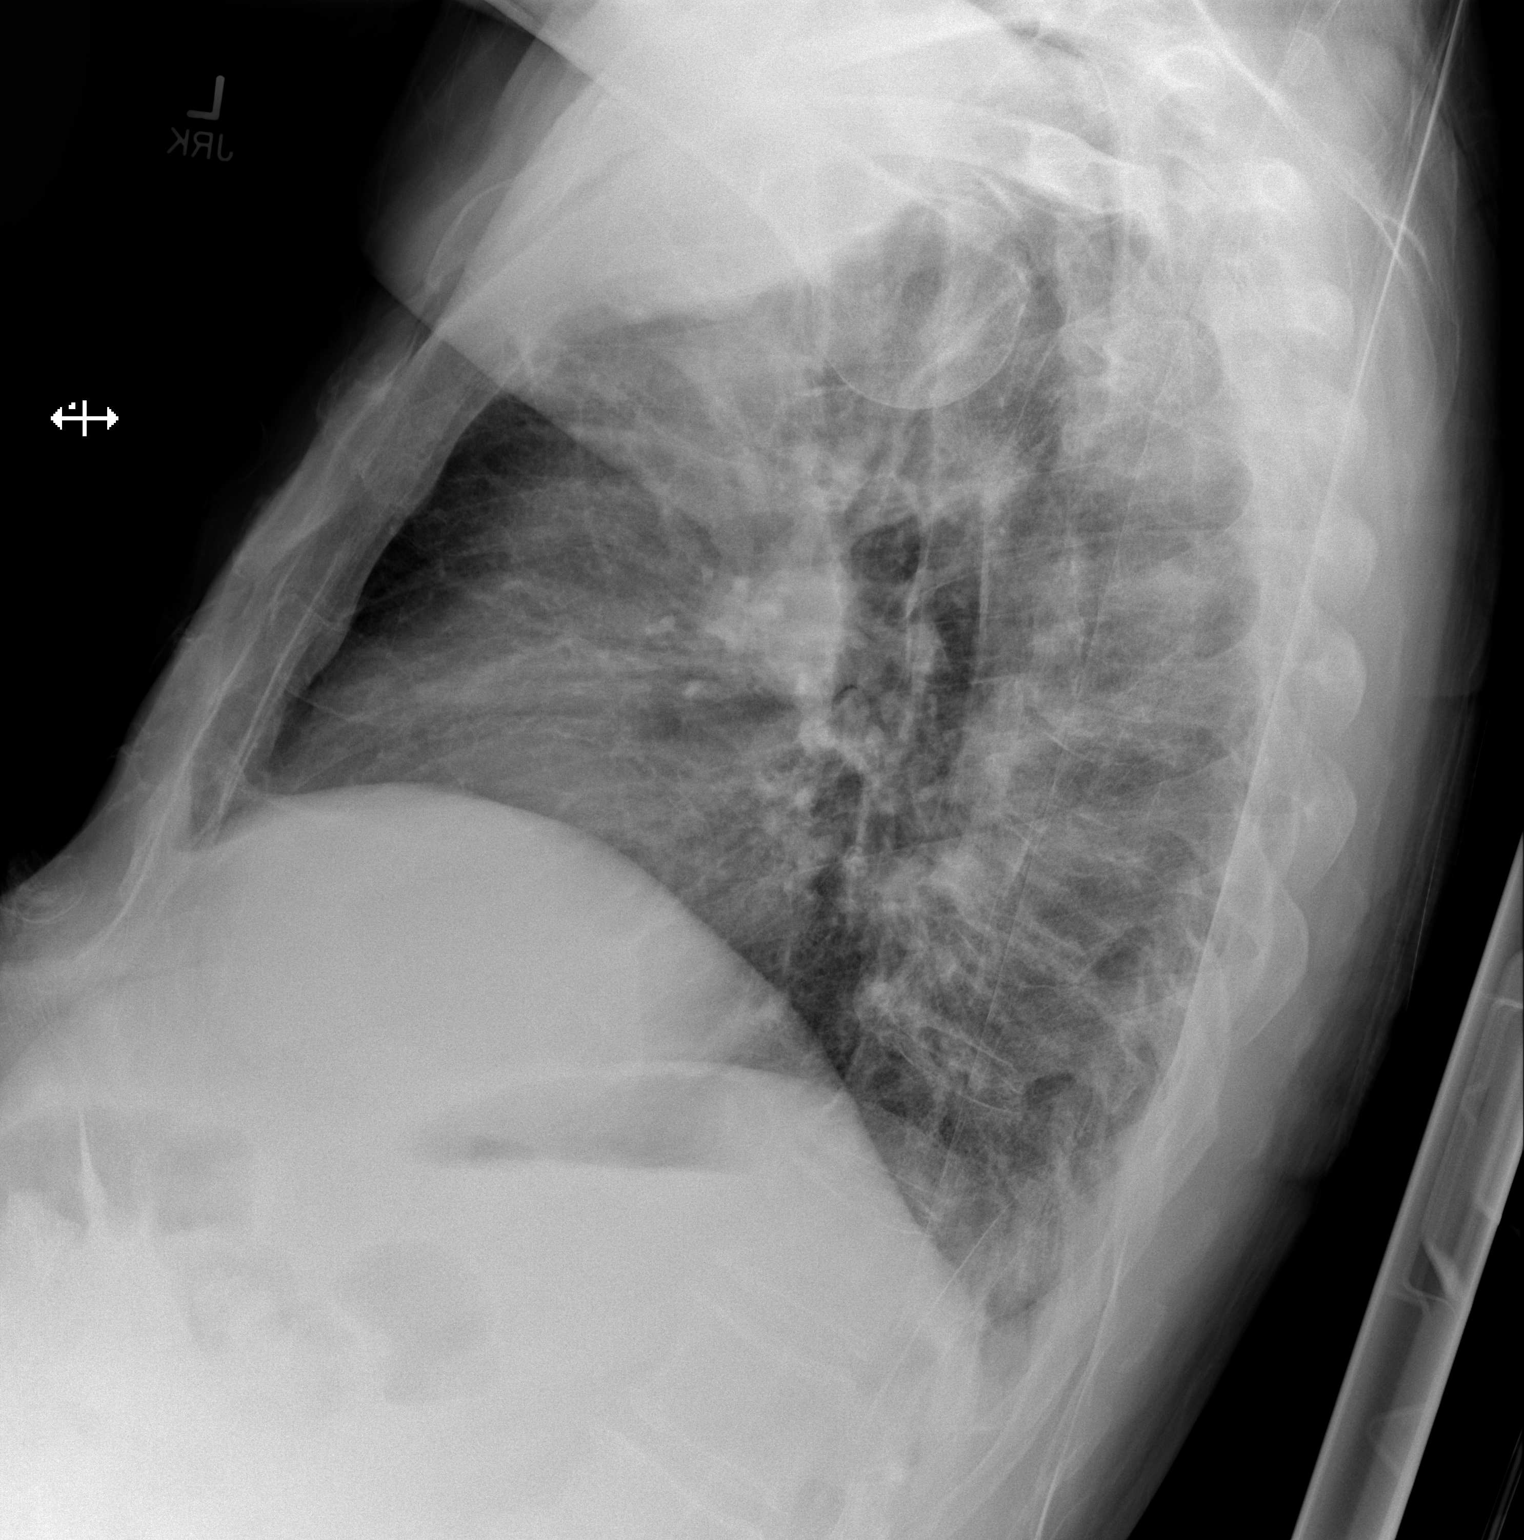
[im 2/2]
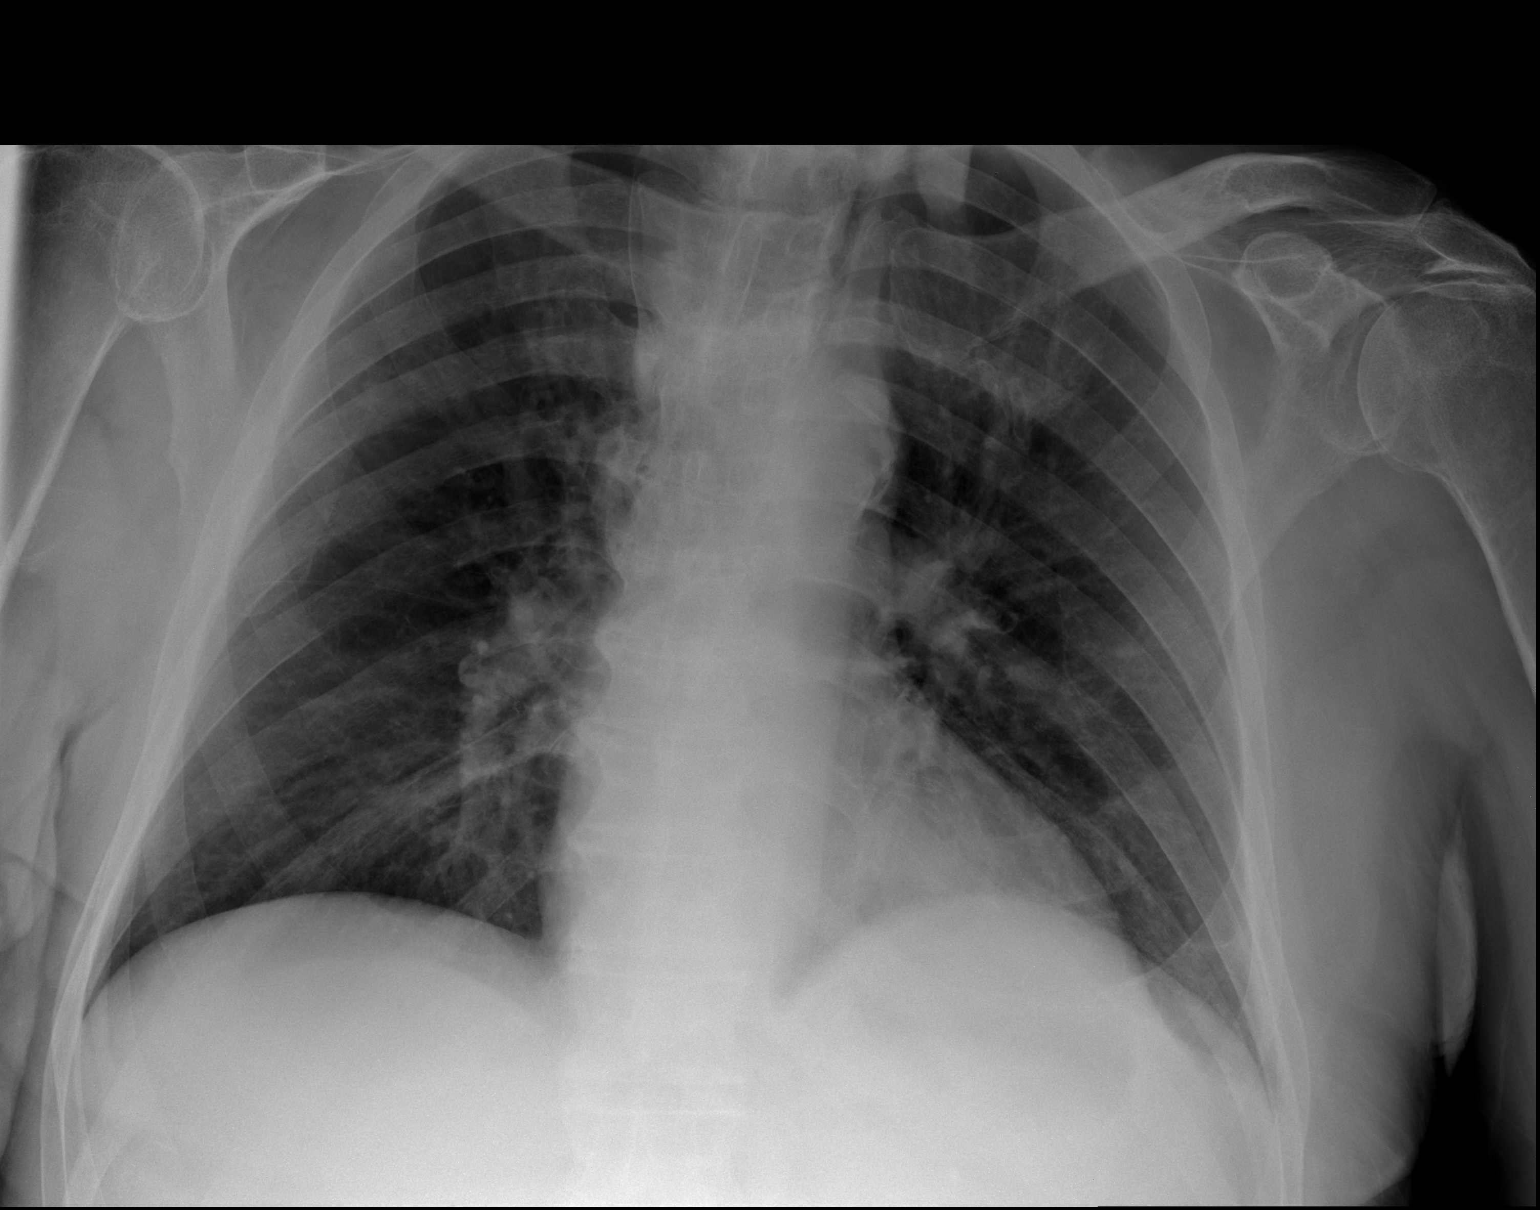

[2 of 2 positions shown; findings below may reference images not displayed]

FINDINGS: The lungs are well-aerated. Mild peribronchial thickening is noted.
There is no evidence of focal opacification, pleural effusion or
pneumothorax.

The heart is normal in size; the mediastinal contour is within
normal limits. No acute osseous abnormalities are seen.
IMPRESSION: Mild peribronchial thickening noted.  Lungs otherwise clear.

## 2015-07-27 NOTE — ED Notes (Signed)
Pt from home.  Per EMS:  Pt had a syncope episode at home.  States that he has been weak today.  Pt had 1 BM on himself.  States that he is at his baseline currently.  Slurred speech from prior CVA.   Pt denies acute pain at this time.  [place] per EMS  CBG 218

## 2015-07-27 NOTE — ED Provider Notes (Signed)
[facility] Emergency Department Provider Note  ____________________________________________  Time seen: 11:00 PM  I have reviewed the triage vital signs and the nursing notes.   HISTORY  Chief Complaint Loss of Consciousness     HPI Robert Rojas is a 81 y.o. male presents with history of syncopal episode today at home. Patient admits to feeling weak all over but however denies any pain no nausea no vomiting no dysuria but does admit to urinary hesitancy. Of note patient has a history of a CVA. While at bedside patient noted to be in atrial fibrillation. Patient states that he has no known history of A. fib.     Past Medical History  Diagnosis Date  . Coronary artery disease   . Essential hypertension   . Hyperlipemia   . Type 2 diabetes mellitus ([place])   . GERD (gastroesophageal reflux disease)   . TIA (transient ischemic attack)   . BCC (basal cell carcinoma)   . Spinal stenosis   . MI (myocardial infarction) ([place]) 01/2011  . CVA (cerebral infarction)     left sided  . Arthralgia     left knee/patella/tibia/fibula  . Prostatitis   . Erectile dysfunction     Patient Active Problem List   Diagnosis Date Noted  . Erectile dysfunction of organic origin 05/05/2015    Past Surgical History  Procedure Laterality Date  . Coronary angioplasty with stent placement  2007  . Lumbar laminectomy    . Knee surgery Left     Current Outpatient Rx  Name  Route  Sig  Dispense  Refill  . aspirin 81 MG tablet   Oral   Take 81 mg by mouth daily.         . Blood Glucose Monitoring Suppl (ONE TOUCH ULTRA MINI) W/DEVICE KIT               . carvedilol (COREG) 6.25 MG tablet               . cetirizine (ZYRTEC) 10 MG tablet      TAKE 1 TABLET BY MOUTH AT BEDTIME FOR NASAL DRIP      5   . dabigatran (PRADAXA) 150 MG CAPS capsule   Oral   Take 150 mg by mouth 2 (two) times daily.         . digoxin (LANOXIN) 0.25 MG tablet   Oral   Take  0.25 mg by mouth daily.         Marland Kitchen diltiazem (CARDIZEM CD) 240 MG 24 hr capsule               . diltiazem (DILACOR XR) 240 MG 24 hr capsule   Oral   Take 240 mg by mouth daily.         Marland Kitchen diltiazem (TIAZAC) 180 MG 24 hr capsule   Oral   Take by mouth.         . fluticasone (FLONASE) 50 MCG/ACT nasal spray               . furosemide (LASIX) 40 MG tablet   Oral   Take 40 mg by mouth daily.         Marland Kitchen gemfibrozil (LOPID) 600 MG tablet   Oral   Take 600 mg by mouth 2 (two) times daily before a meal.         . HUMALOG KWIKPEN 100 UNIT/ML KiwkPen      USE AS DIRECTED FOR SLIDING SCALE FOUR TIMES DAILY. MAX  OF 48 UNITS PER DAY      0     Dispense as written.   . insulin aspart (NOVOLOG) 100 UNIT/ML injection   Subcutaneous   Inject into the skin.         Marland Kitchen JANUVIA 100 MG tablet   Oral   Take 100 mg by mouth daily.      5     Dispense as written.   Marland Kitchen LANTUS SOLOSTAR 100 UNIT/ML Solostar Pen                 Dispense as written.   Marland Kitchen lisinopril (PRINIVIL,ZESTRIL) 2.5 MG tablet   Oral   Take 2.5 mg by mouth daily.         . ONE TOUCH ULTRA TEST test strip               . ONETOUCH DELICA LANCETS 16L MISC               . Potassium 99 MG TABS   Oral   Take by mouth.         . potassium chloride SA (KLOR-CON M20) 20 MEQ tablet   Oral   Take 20 mEq by mouth daily.         . pravastatin (PRAVACHOL) 40 MG tablet   Oral   Take 40 mg by mouth daily.         . sertraline (ZOLOFT) 50 MG tablet                 Allergies Lipitor and Lisinopril  Family History  Problem Relation Age of Onset  . Kidney disease Neg Hx   . Prostate cancer Neg Hx     Social History Social History  Substance Use Topics  . Smoking status: Never Smoker   . Smokeless tobacco: None  . Alcohol Use: No    Review of Systems  Constitutional: Negative for fever. Eyes: Negative for visual changes. ENT: Negative for sore throat. Cardiovascular:  Negative for chest pain. Respiratory: Negative for shortness of breath. Gastrointestinal: Negative for abdominal pain, vomiting and diarrhea. Genitourinary: Negative for dysuria. Musculoskeletal: Negative for back pain. Skin: Negative for rash. Neurological: Negative for headaches, focal weakness or numbness. Positive for generalized weakness.   10-point ROS otherwise negative.  ____________________________________________   PHYSICAL EXAM:  VITAL SIGNS: ED Triage Vitals  Enc Vitals Group     BP 07/27/15 2156 188/64 mmHg     Pulse Rate 07/27/15 2152 86     Resp 07/27/15 2152 22     Temp 07/27/15 2152 99.9 F (37.7 C)     Temp Source 07/27/15 2152 Oral     SpO2 07/27/15 2152 97 %     Weight 07/27/15 2152 190 lb (86.183 kg)     Height 07/27/15 2152 6' 1"  (1.854 m)     Head Cir --      Peak Flow --      Pain Score --      Pain Loc --      Pain Edu? --      Excl. in [place]? --      Constitutional: Alert and oriented. Well appearing and in no distress. Eyes: Conjunctivae are normal. PERRL. Normal extraocular movements. ENT   Head: Normocephalic and atraumatic.   Nose: No congestion/rhinnorhea.   Mouth/Throat: Mucous membranes are moist.   Neck: No stridor. Hematological/Lymphatic/Immunilogical: No cervical lymphadenopathy. Cardiovascular: Normal rate, regular rhythm. Normal and symmetric distal pulses are present in all extremities. No  murmurs, rubs, or gallops. Respiratory: Normal respiratory effort without tachypnea nor retractions. Breath sounds are clear and equal bilaterally. No wheezes/rales/rhonchi. Gastrointestinal: Soft and nontender. No distention. There is no CVA tenderness. Genitourinary: deferred Musculoskeletal: Nontender with normal range of motion in all extremities. No joint effusions.  No lower extremity tenderness nor edema. Neurologic:  Normal speech and language. No gross focal neurologic deficits are appreciated. Speech is normal.  Skin:   Skin is warm, dry and intact. No rash noted. Psychiatric: Mood and affect are normal. Speech and behavior are normal. Patient exhibits appropriate insight and judgment.  ____________________________________________    LABS (pertinent positives/negatives)  Labs Reviewed  CBC WITH DIFFERENTIAL/PLATELET - Abnormal; Notable for the following:    WBC 17.4 (*)    RDW 15.2 (*)    Neutro Abs 15.5 (*)    Lymphs Abs 0.7 (*)    All other components within normal limits  COMPREHENSIVE METABOLIC PANEL  TROPONIN I  TROPONIN I  TROPONIN I     ____________________________________________   EKG ED ECG REPORT I, Jodean Valade, [place], the attending physician, personally viewed and interpreted this ECG.   Date: 07/28/2015  EKG Time: 9:53 PM  Rate: 83  Rhythm: Atrial fibrillation  Axis: None  Intervals: Irregular  ST&T Change: Normal    ____________________________________________    RADIOLOGY     CT Head Wo Contrast (Final result) Result time: 07/27/15 23:35:23   Final result by Rad Results In Interface (07/27/15 23:35:23)   Narrative:   CLINICAL DATA: Syncope. Personal history of stroke.  EXAM: CT HEAD WITHOUT CONTRAST  TECHNIQUE: Contiguous axial images were obtained from the base of the skull through the vertex without intravenous contrast.  COMPARISON: Head CT 06/24/2013  FINDINGS: The brain shows generalized atrophy. There are extensive chronic ischemic changes affecting the brainstem. No focal cerebellar insult. The cerebral hemispheres show old infarctions in the basal ganglia regions and thalami, right worse than left. No cortical or large vessel territory infarction. No sign of acute infarction, mass lesion, hemorrhage, hydrocephalus or extra-axial collection. The calvarium is unremarkable. Sinuses, and middle ears are clear. Some fluid in the mastoid air cells on the right. There is atherosclerotic calcification of the major vessels at the base of the  brain.  IMPRESSION: No acute finding by CT. Atrophy, extensive chronic small-vessel ischemic changes affecting the brainstem, thalami and basal ganglia.   Electronically Signed By: Nelson Chimes M.D. On: 07/27/2015 23:35          DG Chest 2 View (Final result) Result time: 07/27/15 23:09:59   Final result by Rad Results In Interface (07/27/15 23:09:59)   Narrative:   CLINICAL DATA: Status post syncope. Weakness. Initial encounter.  EXAM: CHEST 2 VIEW  COMPARISON: Chest radiograph performed 06/24/2013  FINDINGS: The lungs are well-aerated. Mild peribronchial thickening is noted. There is no evidence of focal opacification, pleural effusion or pneumothorax.  The heart is normal in size; the mediastinal contour is within normal limits. No acute osseous abnormalities are seen.  IMPRESSION: Mild peribronchial thickening noted. Lungs otherwise clear.   Electronically Signed By: Garald Balding M.D. On: 07/27/2015 23:09         INITIAL IMPRESSION / ASSESSMENT AND PLAN / ED COURSE  Pertinent labs & imaging results that were available during my care of the patient were reviewed by me and considered in my medical decision making (see chart for details).    ____________________________________________   FINAL CLINICAL IMPRESSION(S) / ED DIAGNOSES  Final diagnoses:  Syncope, unspecified syncope type  Gregor Hams, MD 07/28/15 [phone]

## 2015-07-28 ENCOUNTER — Inpatient Hospital Stay: Admit: 2015-07-28 | Payer: Medicare Other

## 2015-07-28 ENCOUNTER — Observation Stay
Admit: 2015-07-28 | Discharge: 2015-07-28 | Disposition: A | Discharge status: Home / Self Care | Payer: Medicare Other | Attending: Internal Medicine | Admitting: Internal Medicine

## 2015-07-28 DIAGNOSIS — I493 Ventricular premature depolarization: Secondary | ICD-10-CM | POA: Diagnosis present

## 2015-07-28 DIAGNOSIS — F0390 Unspecified dementia without behavioral disturbance: Secondary | ICD-10-CM | POA: Diagnosis present

## 2015-07-28 DIAGNOSIS — Z79899 Other long term (current) drug therapy: Secondary | ICD-10-CM | POA: Diagnosis not present

## 2015-07-28 DIAGNOSIS — R4781 Slurred speech: Secondary | ICD-10-CM | POA: Diagnosis present

## 2015-07-28 DIAGNOSIS — N390 Urinary tract infection, site not specified: Secondary | ICD-10-CM | POA: Diagnosis present

## 2015-07-28 DIAGNOSIS — R55 Syncope and collapse: Secondary | ICD-10-CM | POA: Diagnosis present

## 2015-07-28 DIAGNOSIS — M4800 Spinal stenosis, site unspecified: Secondary | ICD-10-CM | POA: Diagnosis present

## 2015-07-28 DIAGNOSIS — I69398 Other sequelae of cerebral infarction: Secondary | ICD-10-CM | POA: Diagnosis not present

## 2015-07-28 DIAGNOSIS — I214 Non-ST elevation (NSTEMI) myocardial infarction: Secondary | ICD-10-CM | POA: Diagnosis present

## 2015-07-28 DIAGNOSIS — I2510 Atherosclerotic heart disease of native coronary artery without angina pectoris: Secondary | ICD-10-CM | POA: Diagnosis present

## 2015-07-28 DIAGNOSIS — Z7901 Long term (current) use of anticoagulants: Secondary | ICD-10-CM | POA: Diagnosis not present

## 2015-07-28 DIAGNOSIS — Z955 Presence of coronary angioplasty implant and graft: Secondary | ICD-10-CM | POA: Diagnosis not present

## 2015-07-28 DIAGNOSIS — I10 Essential (primary) hypertension: Secondary | ICD-10-CM | POA: Diagnosis present

## 2015-07-28 DIAGNOSIS — Z794 Long term (current) use of insulin: Secondary | ICD-10-CM | POA: Diagnosis not present

## 2015-07-28 DIAGNOSIS — Z85828 Personal history of other malignant neoplasm of skin: Secondary | ICD-10-CM | POA: Diagnosis not present

## 2015-07-28 DIAGNOSIS — Z7982 Long term (current) use of aspirin: Secondary | ICD-10-CM | POA: Diagnosis not present

## 2015-07-28 DIAGNOSIS — J209 Acute bronchitis, unspecified: Secondary | ICD-10-CM | POA: Diagnosis present

## 2015-07-28 DIAGNOSIS — I429 Cardiomyopathy, unspecified: Secondary | ICD-10-CM | POA: Diagnosis present

## 2015-07-28 DIAGNOSIS — K219 Gastro-esophageal reflux disease without esophagitis: Secondary | ICD-10-CM | POA: Diagnosis present

## 2015-07-28 DIAGNOSIS — I252 Old myocardial infarction: Secondary | ICD-10-CM | POA: Diagnosis not present

## 2015-07-28 DIAGNOSIS — I482 Chronic atrial fibrillation: Secondary | ICD-10-CM | POA: Diagnosis present

## 2015-07-28 DIAGNOSIS — Z66 Do not resuscitate: Secondary | ICD-10-CM | POA: Diagnosis present

## 2015-07-28 DIAGNOSIS — Z23 Encounter for immunization: Secondary | ICD-10-CM | POA: Diagnosis not present

## 2015-07-28 DIAGNOSIS — B9620 Unspecified Escherichia coli [E. coli] as the cause of diseases classified elsewhere: Secondary | ICD-10-CM | POA: Diagnosis present

## 2015-07-28 DIAGNOSIS — E785 Hyperlipidemia, unspecified: Secondary | ICD-10-CM | POA: Diagnosis present

## 2015-07-28 LAB — TROPONIN I
TROPONIN I: 0.07 ng/mL — AB (ref <0.031)
Troponin I: 1.83 ng/mL — ABNORMAL HIGH (ref <0.031)
Troponin I: 0.14 ng/mL — ABNORMAL HIGH (ref <0.031)
Troponin I: 5.21 ng/mL — ABNORMAL HIGH (ref <0.031)

## 2015-07-28 LAB — COMPREHENSIVE METABOLIC PANEL
ALBUMIN: 4 g/dL (ref 3.5–5.0)
ALK PHOS: 155 U/L — AB (ref 38–126)
ALT: 95 U/L — ABNORMAL HIGH (ref 17–63)
ANION GAP: 9 (ref 5–15)
AST: 117 U/L — ABNORMAL HIGH (ref 15–41)
BILIRUBIN TOTAL: 1.1 mg/dL (ref 0.3–1.2)
BUN: 17 mg/dL (ref 6–20)
CALCIUM: 9 mg/dL (ref 8.9–10.3)
CO2: 27 mmol/L (ref 22–32)
Chloride: 102 mmol/L (ref 101–111)
Creatinine, Ser: 1.18 mg/dL (ref 0.61–1.24)
GFR calc non Af Amer: 55 mL/min — ABNORMAL LOW (ref >60)
Glucose, Bld: 221 mg/dL — ABNORMAL HIGH (ref 65–99)
POTASSIUM: 4 mmol/L (ref 3.5–5.1)
Sodium: 138 mmol/L (ref 135–145)
TOTAL PROTEIN: 7.7 g/dL (ref 6.5–8.1)

## 2015-07-28 LAB — CBC WITH DIFFERENTIAL/PLATELET
BASOS PCT: 0 %
Basophils Absolute: 0.1 10*3/uL (ref 0–0.1)
EOS ABS: 0.1 10*3/uL (ref 0–0.7)
Eosinophils Relative: 1 %
HEMATOCRIT: 41.7 % (ref 40.0–52.0)
HEMOGLOBIN: 14.1 g/dL (ref 13.0–18.0)
LYMPHS ABS: 0.7 10*3/uL — AB (ref 1.0–3.6)
Lymphocytes Relative: 4 %
MCH: 31.1 pg (ref 26.0–34.0)
MCHC: 33.8 g/dL (ref 32.0–36.0)
MCV: 92.1 fL (ref 80.0–100.0)
MONO ABS: 0.9 10*3/uL (ref 0.2–1.0)
MONOS PCT: 5 %
NEUTROS ABS: 15.5 10*3/uL — AB (ref 1.4–6.5)
NEUTROS PCT: 90 %
Platelets: 270 10*3/uL (ref 150–440)
RBC: 4.53 MIL/uL (ref 4.40–5.90)
RDW: 15.2 % — AB (ref 11.5–14.5)
WBC: 17.4 10*3/uL — ABNORMAL HIGH (ref 3.8–10.6)

## 2015-07-28 LAB — GLUCOSE, CAPILLARY
GLUCOSE-CAPILLARY: 250 mg/dL — AB (ref 65–99)
Glucose-Capillary: 286 mg/dL — ABNORMAL HIGH (ref 65–99)
Glucose-Capillary: 304 mg/dL — ABNORMAL HIGH (ref 65–99)
Glucose-Capillary: 324 mg/dL — ABNORMAL HIGH (ref 65–99)

## 2015-07-28 MED ORDER — AZITHROMYCIN 250 MG PO TABS
500.0000 mg | ORAL_TABLET | Freq: Every day | ORAL | Status: DC
  Administered 2015-07-28 – 2015-07-29 (×2): 500 mg via ORAL
  Filled 2015-07-28 (×2): qty 2

## 2015-07-28 MED ORDER — ACETAMINOPHEN 650 MG RE SUPP
650.0000 mg | Freq: Four times a day (QID) | RECTAL | Status: DC | PRN
Start: 2015-07-28 — End: 2015-07-31

## 2015-07-28 MED ORDER — ACETAMINOPHEN 325 MG PO TABS: 650.0000 mg | ORAL_TABLET | Freq: Four times a day (QID) | ORAL | Status: DC | PRN

## 2015-07-28 MED ORDER — SODIUM CHLORIDE 0.9 % IV SOLN
1.5000 g | Freq: Four times a day (QID) | INTRAVENOUS | Status: DC
Start: 2015-07-28 — End: 2015-07-31
  Administered 2015-07-28 – 2015-07-31 (×12): 1.5 g via INTRAVENOUS
  Filled 2015-07-28 (×14): qty 1.5

## 2015-07-28 MED ORDER — INSULIN GLARGINE 100 UNIT/ML ~~LOC~~ SOLN
20.0000 [IU] | Freq: Every day | SUBCUTANEOUS | Status: DC
  Administered 2015-07-28: 20 [IU] via SUBCUTANEOUS
  Filled 2015-07-28 (×2): qty 0.2

## 2015-07-28 MED ORDER — FUROSEMIDE 40 MG PO TABS
40.0000 mg | ORAL_TABLET | Freq: Every day | ORAL | Status: DC
  Administered 2015-07-28 – 2015-07-31 (×4): 40 mg via ORAL
  Filled 2015-07-28 (×4): qty 1

## 2015-07-28 MED ORDER — DIGOXIN 250 MCG PO TABS
0.2500 mg | ORAL_TABLET | Freq: Every day | ORAL | Status: DC
  Administered 2015-07-28 – 2015-07-31 (×4): 0.25 mg via ORAL
  Filled 2015-07-28 (×4): qty 1

## 2015-07-28 MED ORDER — CARVEDILOL 6.25 MG PO TABS
6.2500 mg | ORAL_TABLET | Freq: Two times a day (BID) | ORAL | Status: DC
  Administered 2015-07-28 – 2015-07-31 (×7): 6.25 mg via ORAL
  Filled 2015-07-28 (×7): qty 1

## 2015-07-28 MED ORDER — FLUTICASONE PROPIONATE 50 MCG/ACT NA SUSP
1.0000 | Freq: Every day | NASAL | Status: DC
  Administered 2015-07-29 – 2015-07-31 (×3): 1 via NASAL
  Filled 2015-07-28 (×2): qty 16

## 2015-07-28 MED ORDER — ENOXAPARIN SODIUM 100 MG/ML ~~LOC~~ SOLN
1.0000 mg/kg | Freq: Two times a day (BID) | SUBCUTANEOUS | Status: DC
Start: 2015-07-28 — End: 2015-07-28

## 2015-07-28 MED ORDER — ROSUVASTATIN CALCIUM 20 MG PO TABS
20.0000 mg | ORAL_TABLET | Freq: Every day | ORAL | Status: DC
  Administered 2015-07-28 – 2015-07-30 (×3): 20 mg via ORAL
  Filled 2015-07-28 (×3): qty 1

## 2015-07-28 MED ORDER — DABIGATRAN ETEXILATE MESYLATE 75 MG PO CAPS
150.0000 mg | ORAL_CAPSULE | Freq: Two times a day (BID) | ORAL | Status: DC
  Administered 2015-07-28: 150 mg via ORAL
  Filled 2015-07-28: qty 2

## 2015-07-28 MED ORDER — SODIUM CHLORIDE 0.9 % IJ SOLN
3.0000 mL | Freq: Two times a day (BID) | INTRAMUSCULAR | Status: DC
  Administered 2015-07-28 – 2015-07-31 (×8): 3 mL via INTRAVENOUS

## 2015-07-28 MED ORDER — GEMFIBROZIL 600 MG PO TABS
600.0000 mg | ORAL_TABLET | Freq: Two times a day (BID) | ORAL | Status: DC
  Administered 2015-07-28 – 2015-07-31 (×7): 600 mg via ORAL
  Filled 2015-07-28 (×7): qty 1

## 2015-07-28 MED ORDER — ONDANSETRON HCL 4 MG/2ML IJ SOLN: 4.0000 mg | Freq: Four times a day (QID) | INTRAMUSCULAR | Status: DC | PRN

## 2015-07-28 MED ORDER — PREDNISONE 50 MG PO TABS
50.0000 mg | ORAL_TABLET | Freq: Every day | ORAL | Status: DC
  Administered 2015-07-28 – 2015-07-29 (×2): 50 mg via ORAL
  Filled 2015-07-28 (×2): qty 1

## 2015-07-28 MED ORDER — POTASSIUM CHLORIDE CRYS ER 20 MEQ PO TBCR
20.0000 meq | EXTENDED_RELEASE_TABLET | Freq: Every day | ORAL | Status: DC
  Administered 2015-07-28 – 2015-07-31 (×4): 20 meq via ORAL
  Filled 2015-07-28 (×4): qty 1

## 2015-07-28 MED ORDER — INSULIN ASPART 100 UNIT/ML ~~LOC~~ SOLN
0.0000 [IU] | Freq: Three times a day (TID) | SUBCUTANEOUS | Status: DC
  Administered 2015-07-28: 3 [IU] via SUBCUTANEOUS
  Administered 2015-07-28: 7 [IU] via SUBCUTANEOUS
  Administered 2015-07-28 – 2015-07-29 (×2): 5 [IU] via SUBCUTANEOUS
  Administered 2015-07-29: 3 [IU] via SUBCUTANEOUS
  Administered 2015-07-29: 7 [IU] via SUBCUTANEOUS
  Administered 2015-07-30 – 2015-07-31 (×4): 2 [IU] via SUBCUTANEOUS
  Filled 2015-07-28: qty 7
  Filled 2015-07-28: qty 2
  Filled 2015-07-28: qty 5
  Filled 2015-07-28: qty 2
  Filled 2015-07-28: qty 3
  Filled 2015-07-28: qty 5
  Filled 2015-07-28: qty 2
  Filled 2015-07-28: qty 7
  Filled 2015-07-28 (×2): qty 3

## 2015-07-28 MED ORDER — ALBUTEROL SULFATE (2.5 MG/3ML) 0.083% IN NEBU: 2.5000 mg | INHALATION_SOLUTION | RESPIRATORY_TRACT | Status: DC | PRN

## 2015-07-28 MED ORDER — DOCUSATE SODIUM 100 MG PO CAPS
100.0000 mg | ORAL_CAPSULE | Freq: Two times a day (BID) | ORAL | Status: DC
  Administered 2015-07-28 – 2015-07-31 (×6): 100 mg via ORAL
  Filled 2015-07-28 (×6): qty 1

## 2015-07-28 MED ORDER — HYDROCODONE-ACETAMINOPHEN 5-325 MG PO TABS: 1.0000 | ORAL_TABLET | ORAL | Status: DC | PRN

## 2015-07-28 MED ORDER — METOCLOPRAMIDE HCL 5 MG/ML IJ SOLN
5.0000 mg | Freq: Once | INTRAMUSCULAR | Status: AC
  Administered 2015-07-28: 5 mg via INTRAVENOUS
  Filled 2015-07-28: qty 1

## 2015-07-28 MED ORDER — SODIUM CHLORIDE 0.9 % IV BOLUS (SEPSIS)
500.0000 mL | Freq: Once | INTRAVENOUS | Status: AC
  Administered 2015-07-28: 500 mL via INTRAVENOUS

## 2015-07-28 MED ORDER — LINAGLIPTIN 5 MG PO TABS
5.0000 mg | ORAL_TABLET | Freq: Every day | ORAL | Status: DC
  Administered 2015-07-28: 5 mg via ORAL
  Filled 2015-07-28: qty 1

## 2015-07-28 MED ORDER — ONDANSETRON HCL 4 MG PO TABS: 4.0000 mg | ORAL_TABLET | Freq: Four times a day (QID) | ORAL | Status: DC | PRN

## 2015-07-28 MED ORDER — INSULIN ASPART 100 UNIT/ML ~~LOC~~ SOLN
0.0000 [IU] | Freq: Every day | SUBCUTANEOUS | Status: DC
  Administered 2015-07-28 – 2015-07-29 (×2): 4 [IU] via SUBCUTANEOUS
  Filled 2015-07-28 (×2): qty 4

## 2015-07-28 MED ORDER — CETYLPYRIDINIUM CHLORIDE 0.05 % MT LIQD
7.0000 mL | Freq: Two times a day (BID) | OROMUCOSAL | Status: DC
  Administered 2015-07-28 – 2015-07-31 (×7): 7 mL via OROMUCOSAL

## 2015-07-28 MED ORDER — PRAVASTATIN SODIUM 40 MG PO TABS
40.0000 mg | ORAL_TABLET | Freq: Every day | ORAL | Status: DC
  Administered 2015-07-28: 40 mg via ORAL
  Filled 2015-07-28: qty 1

## 2015-07-28 MED ORDER — ENOXAPARIN SODIUM 100 MG/ML ~~LOC~~ SOLN
1.0000 mg/kg | Freq: Two times a day (BID) | SUBCUTANEOUS | Status: DC
  Administered 2015-07-28 – 2015-07-30 (×4): 90 mg via SUBCUTANEOUS
  Filled 2015-07-28 (×4): qty 1

## 2015-07-28 MED ORDER — ASPIRIN EC 81 MG PO TBEC
81.0000 mg | DELAYED_RELEASE_TABLET | Freq: Every day | ORAL | Status: DC
  Administered 2015-07-28 – 2015-07-31 (×4): 81 mg via ORAL
  Filled 2015-07-28 (×4): qty 1

## 2015-07-28 MED ORDER — SERTRALINE HCL 50 MG PO TABS
50.0000 mg | ORAL_TABLET | Freq: Every day | ORAL | Status: DC
  Administered 2015-07-28 – 2015-07-31 (×4): 50 mg via ORAL
  Filled 2015-07-28 (×4): qty 1

## 2015-07-28 MED ORDER — POLYETHYLENE GLYCOL 3350 17 G PO PACK: 17.0000 g | PACK | Freq: Every day | ORAL | Status: DC | PRN

## 2015-07-28 MED ORDER — DILTIAZEM HCL ER BEADS 180 MG PO CP24: 180.0000 mg | ORAL_CAPSULE | Freq: Every day | ORAL | Status: DC

## 2015-07-28 MED ORDER — DILTIAZEM HCL ER COATED BEADS 180 MG PO CP24
180.0000 mg | ORAL_CAPSULE | Freq: Every day | ORAL | Status: DC
  Administered 2015-07-28 – 2015-07-31 (×4): 180 mg via ORAL
  Filled 2015-07-28 (×4): qty 1

## 2015-07-28 NOTE — Care Management (Signed)
Informed during progression that patient is the primary caregiver for his wife who has dementia.  Patient did not volunteer this information.  It is reported via his daughter to the primary nurse

## 2015-07-28 NOTE — Care Management (Signed)
Patient admitted for weakness.  Patient says "I felt tired."  he resides in an apartment on [address] with his wife.  Patient is hard of hearing per his wife and this may be why he seems somewhat confused to this CM.  Per his wife- [place] is able to ambulate at home without assist with his walker.  Wife says he can feed himself and bathe himself.  His daughter assists with errands and transportation.  Physical therapy consult is pending.  Wife and patient agreeable to home health services if it is recommended.

## 2015-07-28 NOTE — Plan of Care (Signed)
Problem: Acute Rehab PT Goals(only PT should resolve) Goal: Pt Will Go Supine/Side To Sit Pt will demonstrate supervision level bed mobility supine to/from sitting edge-of-bed to return to PLOF and to decrease caregiver burden.     Goal: Patient Will Transfer Sit To/From Stand Pt will transfer sit to/from-stand with RW at [place] without loss-of-balance to demonstrate good safety awareness for independent mobility in home.     Goal: Pt Will Ambulate Pt will ambulate with RW at Supervision using a step-through pattern and equal step length for a distances greater than 135ft to demonstrate the ability to perform safe household distance ambulation at discharge.

## 2015-07-28 NOTE — Consult Note (Signed)
[facility] Infectious Disease     Reason for Consult:GNR bacteremia Referring Physician: Manuella Ghazi te of Admission:  07/27/2015   Active Problems:   Syncope   HPI: Robert Rojas is a 81 y.o. male with CVA< HTN CAD< DM admitted iwht syncope. Since the has had + [place] for GNR.  He is a poor historian but denies cp, sob, abd pain, v,b, diarrhea.    Past Medical History  Diagnosis Date  . Coronary artery disease   . Essential hypertension   . Hyperlipemia   . Type 2 diabetes mellitus ([place])   . GERD (gastroesophageal reflux disease)   . TIA (transient ischemic attack)   . BCC (basal cell carcinoma)   . Spinal stenosis   . MI (myocardial infarction) ([place]) 01/2011  . CVA (cerebral infarction)     left sided  . Arthralgia     left knee/patella/tibia/fibula  . Prostatitis   . Erectile dysfunction    Past Surgical History  Procedure Laterality Date  . Coronary angioplasty with stent placement  2007  . Lumbar laminectomy    . Knee surgery Left    Social History  Substance Use Topics  . Smoking status: Never Smoker   . Smokeless tobacco: None  . Alcohol Use: No   Family History  Problem Relation Age of Onset  . Kidney disease Neg Hx   . Prostate cancer Neg Hx     Allergies:  Allergies  Allergen Reactions  . Lipitor [Atorvastatin]   . Lisinopril     Current antibiotics: Antibiotics Given (last 72 hours)    Date/Time Action Medication Dose   07/28/15 0223 Given   azithromycin (ZITHROMAX) tablet 500 mg 500 mg      MEDICATIONS: . ampicillin-sulbactam (UNASYN) IV  1.5 g Intravenous Q6H  . antiseptic oral rinse  7 mL Mouth Rinse BID  . aspirin EC  81 mg Oral Daily  . azithromycin  500 mg Oral Daily  . carvedilol  6.25 mg Oral BID WC  . digoxin  0.25 mg Oral Daily  . diltiazem  180 mg Oral Daily  . docusate sodium  100 mg Oral BID  . enoxaparin (LOVENOX) injection  1 mg/kg Subcutaneous Q12H  . fluticasone  1 spray Each Nare Daily  . furosemide  40 mg Oral Daily  .  gemfibrozil  600 mg Oral BID AC  . insulin aspart  0-5 Units Subcutaneous QHS  . insulin aspart  0-9 Units Subcutaneous TID WC  . insulin glargine  20 Units Subcutaneous QHS  . potassium chloride SA  20 mEq Oral Daily  . predniSONE  50 mg Oral Q breakfast  . rosuvastatin  20 mg Oral q1800  . sertraline  50 mg Oral Daily  . sodium chloride  3 mL Intravenous Q12H    Review of Systems - 11 systems reviewed and negative per HPI   OBJECTIVE: Temp:  [98.3 F (36.8 C)-99.9 F (37.7 C)] 98.9 F (37.2 C) (11/15 1149) Pulse Rate:  [76-91] 76 (11/15 1536) Resp:  [16-24] 18 (11/15 1149) BP: (143-188)/(62-86) 143/62 mmHg (11/15 1149) SpO2:  [92 %-98 %] 94 % (11/15 1536) Weight:  [86.183 kg (190 lb)-88.406 kg (194 lb 14.4 oz)] 88.406 kg (194 lb 14.4 oz) (11/15 0136)  Physical Exam  Constitutional:dishelved, diff to understand  Mouth/Throat: Oropharynx is clear and moist. No oropharyngeal exudate.  Cardiovascular: Normal rate, regular rhythm and normal heart sounds. Exam reveals no gallop and no friction rub.  No murmur heard.  Pulmonary/Chest: Effort normal and breath  sounds normal. No respiratory distress. He has no wheezes.  Abdominal: Soft. Bowel sounds are normal. He exhibits no distension. There is no tenderness. ,mild ttp lower qadrants o cervical adenopathy.  Neurological: He is alert and oriented to person, place, and time.  Skin: Skin is warm and dry. No rash noted. No erythema.  Psychiatric: He has a normal mood and affect. His behavior is normal.      LABS: Results for orders placed or performed during the hospital encounter of 07/27/15 (from the past 48 hour(s))  Troponin I     Status: Abnormal   Collection Time: 07/27/15 11:21 AM  Result Value Ref Range   Troponin I 1.83 (H) <0.031 ng/mL    Comment: READ BACK AND VERIFIED WITH LEIGHA LEE AT 1226 07/28/15 DAS        POSSIBLE MYOCARDIAL ISCHEMIA. SERIAL TESTING RECOMMENDED.   CBC with Differential/Platelet     Status:  Abnormal   Collection Time: 07/27/15 10:06 PM  Result Value Ref Range   WBC 17.4 (H) 3.8 - 10.6 K/uL   RBC 4.53 4.40 - 5.90 MIL/uL   Hemoglobin 14.1 13.0 - 18.0 g/dL   HCT 41.7 40.0 - 52.0 %   MCV 92.1 80.0 - 100.0 fL   MCH 31.1 26.0 - 34.0 pg   MCHC 33.8 32.0 - 36.0 g/dL   RDW 15.2 (H) 11.5 - 14.5 %   Platelets 270 150 - 440 K/uL   Neutrophils Relative % 90 %   Neutro Abs 15.5 (H) 1.4 - 6.5 K/uL   Lymphocytes Relative 4 %   Lymphs Abs 0.7 (L) 1.0 - 3.6 K/uL   Monocytes Relative 5 %   Monocytes Absolute 0.9 0.2 - 1.0 K/uL   Eosinophils Relative 1 %   Eosinophils Absolute 0.1 0 - 0.7 K/uL   Basophils Relative 0 %   Basophils Absolute 0.1 0 - 0.1 K/uL  Comprehensive metabolic panel     Status: Abnormal   Collection Time: 07/27/15 10:06 PM  Result Value Ref Range   Sodium 138 135 - 145 mmol/L   Potassium 4.0 3.5 - 5.1 mmol/L   Chloride 102 101 - 111 mmol/L   CO2 27 22 - 32 mmol/L   Glucose, Bld 221 (H) 65 - 99 mg/dL   BUN 17 6 - 20 mg/dL   Creatinine, Ser 1.18 0.61 - 1.24 mg/dL   Calcium 9.0 8.9 - 10.3 mg/dL   Total Protein 7.7 6.5 - 8.1 g/dL   Albumin 4.0 3.5 - 5.0 g/dL   AST 117 (H) 15 - 41 U/L   ALT 95 (H) 17 - 63 U/L   Alkaline Phosphatase 155 (H) 38 - 126 U/L   Total Bilirubin 1.1 0.3 - 1.2 mg/dL   GFR calc non Af Amer 55 (L) >60 mL/min   GFR calc Af Amer >60 >60 mL/min    Comment: (NOTE) The eGFR has been calculated using the CKD EPI equation. This calculation has not been validated in all clinical situations. eGFR's persistently <60 mL/min signify possible Chronic Kidney Disease.    Anion gap 9 5 - 15  Troponin I     Status: Abnormal   Collection Time: 07/27/15 10:06 PM  Result Value Ref Range   Troponin I 0.07 (H) <0.031 ng/mL    Comment: READ BACK AND VERIFIED BY JEANINA RODRIQUEZ _0  07/28/15.Marland KitchenAJO        PERSISTENTLY INCREASED TROPONIN VALUES IN THE RANGE OF 0.04-0.49 ng/mL CAN BE SEEN IN:       -UNSTABLE  ANGINA       -CONGESTIVE HEART FAILURE        -MYOCARDITIS       -CHEST TRAUMA       -ARRYHTHMIAS       -LATE PRESENTING MYOCARDIAL INFARCTION       -COPD   CLINICAL FOLLOW-UP RECOMMENDED.   Culture, blood (routine x 2)     Status: None (Preliminary result)   Collection Time: 07/28/15  2:51 AM  Result Value Ref Range   Specimen Description BLOOD    Special Requests NONE    Culture  Setup Time GRAM NEGATIVE RODS    Culture      GRAM NEGATIVE RODS ANAEROBIC BOTTLE ONLY CRITICAL RESULT CALLED TO, READ BACK BY AND VERIFIED WITH: RN [facility] LEE 07/28/15 1546    Report Status PENDING   Culture, blood (routine x 2)     Status: None (Preliminary result)   Collection Time: 07/28/15  2:52 AM  Result Value Ref Range   Specimen Description BLOOD    Special Requests NONE    Culture  Setup Time GRAM NEGATIVE RODS    Culture      GRAM NEGATIVE RODS IN BOTH AEROBIC AND ANAEROBIC BOTTLES CRITICAL RESULT CALLED TO, READ BACK BY AND VERIFIED WITH: RN [facility] LEE 07/28/15 1346    Report Status PENDING   Troponin I     Status: Abnormal   Collection Time: 07/28/15  4:44 AM  Result Value Ref Range   Troponin I 0.14 (H) <0.031 ng/mL    Comment: READ BACK AND VERIFIED BY RACHEL MCRAE _0  07/28/15.Marland KitchenAJO        PERSISTENTLY INCREASED TROPONIN VALUES IN THE RANGE OF 0.04-0.49 ng/mL CAN BE SEEN IN:       -UNSTABLE ANGINA       -CONGESTIVE HEART FAILURE       -MYOCARDITIS       -CHEST TRAUMA       -ARRYHTHMIAS       -LATE PRESENTING MYOCARDIAL INFARCTION       -COPD   CLINICAL FOLLOW-UP RECOMMENDED.   Glucose, capillary     Status: Abnormal   Collection Time: 07/28/15  8:03 AM  Result Value Ref Range   Glucose-Capillary 250 (H) 65 - 99 mg/dL  Glucose, capillary     Status: Abnormal   Collection Time: 07/28/15 11:46 AM  Result Value Ref Range   Glucose-Capillary 286 (H) 65 - 99 mg/dL   No components found for: ESR, C REACTIVE PROTEIN MICRO: Recent Results (from the past 720 hour(s))  Culture, blood (routine x 2)     Status: None  (Preliminary result)   Collection Time: 07/28/15  2:51 AM  Result Value Ref Range Status   Specimen Description BLOOD  Final   Special Requests NONE  Final   Culture  Setup Time GRAM NEGATIVE RODS  Final   Culture   Final    GRAM NEGATIVE RODS ANAEROBIC BOTTLE ONLY CRITICAL RESULT CALLED TO, READ BACK BY AND VERIFIED WITH: RN [facility] LEE 07/28/15 1546    Report Status PENDING  Incomplete  Culture, blood (routine x 2)     Status: None (Preliminary result)   Collection Time: 07/28/15  2:52 AM  Result Value Ref Range Status   Specimen Description BLOOD  Final   Special Requests NONE  Final   Culture  Setup Time GRAM NEGATIVE RODS  Final   Culture   Final    GRAM NEGATIVE RODS IN BOTH AEROBIC AND ANAEROBIC BOTTLES CRITICAL RESULT CALLED  TO, READ BACK BY AND VERIFIED WITH: RN [facility] LEE 07/28/15 1346    Report Status PENDING  Incomplete    IMAGING: Dg Chest 2 View  07/27/2015  CLINICAL DATA:  Status post syncope.  Weakness.  Initial encounter. EXAM: CHEST  2 VIEW COMPARISON:  Chest radiograph performed 06/24/2013 FINDINGS: The lungs are well-aerated. Mild peribronchial thickening is noted. There is no evidence of focal opacification, pleural effusion or pneumothorax. The heart is normal in size; the mediastinal contour is within normal limits. No acute osseous abnormalities are seen. IMPRESSION: Mild peribronchial thickening noted.  Lungs otherwise clear. Electronically Signed   By: Garald Balding M.D.   On: 07/27/2015 23:09   Ct Head Wo Contrast  07/27/2015  CLINICAL DATA:  Syncope.  Personal history of stroke. EXAM: CT HEAD WITHOUT CONTRAST TECHNIQUE: Contiguous axial images were obtained from the base of the skull through the vertex without intravenous contrast. COMPARISON:  Head CT 06/24/2013 FINDINGS: The brain shows generalized atrophy. There are extensive chronic ischemic changes affecting the brainstem. No focal cerebellar insult. The cerebral hemispheres show old infarctions in the  basal ganglia regions and thalami, right worse than left. No cortical or large vessel territory infarction. No sign of acute infarction, mass lesion, hemorrhage, hydrocephalus or extra-axial collection. The calvarium is unremarkable. Sinuses, and middle ears are clear. Some fluid in the mastoid air cells on the right. There is atherosclerotic calcification of the major vessels at the base of the brain. IMPRESSION: No acute finding by CT. Atrophy, extensive chronic small-vessel ischemic changes affecting the brainstem, thalami and basal ganglia. Electronically Signed   By: Nelson Chimes M.D.   On: 07/27/2015 23:35    Assessment:   Icker Swigert is a 81 y.o. male  With GNR sepsis, likely abd source.  Recommendations Unasyn- started  Consider CT abd - mild LLQ abd pain, If worsns will need IV abx Thank you very much for allowing me to participate in the care of this patient. Please call with questions.   Cheral Marker. Ola Spurr, MD

## 2015-07-28 NOTE — H&P (Signed)
[place] [facility] [place] NAME: Robert Rojas    MR#:  [id number]  DATE OF BIRTH:  12-25-1929  DATE OF ADMISSION:  07/27/2015  PRIMARY CARE PHYSICIAN: Perrin Maltese, MD   REQUESTING/REFERRING PHYSICIAN: Dr. Owens Shark  CHIEF COMPLAINT:   Chief Complaint  Patient presents with  . Loss of Consciousness    HISTORY OF PRESENT ILLNESS:  Robert Rojas  is a 81 y.o. male with a known history of CVA, hypertension, diabetes, CAD presents to the emergency room complaining of syncope. Patient has baseline slurred speech and is a poor historian. It is unclear if he had a presyncope or syncope. This episode lasted a few seconds. Without any chest pain or shortness of breath or sweating. He does complain of generalized weakness. At baseline he walks with a walker. In the emergency room he has been found to be in rate controlled A. fib which is chronic. Frequent PVCs. Cardiac enzymes normal. He does have elevated leukocytosis and temperature of 99.9. Chest x-ray shows peribronchial thickening.  PAST MEDICAL HISTORY:   Past Medical History  Diagnosis Date  . Coronary artery disease   . Essential hypertension   . Hyperlipemia   . Type 2 diabetes mellitus ([place])   . GERD (gastroesophageal reflux disease)   . TIA (transient ischemic attack)   . BCC (basal cell carcinoma)   . Spinal stenosis   . MI (myocardial infarction) ([place]) 01/2011  . CVA (cerebral infarction)     left sided  . Arthralgia     left knee/patella/tibia/fibula  . Prostatitis   . Erectile dysfunction     PAST SURGICAL HISTORY:   Past Surgical History  Procedure Laterality Date  . Coronary angioplasty with stent placement  2007  . Lumbar laminectomy    . Knee surgery Left     SOCIAL HISTORY:   Social History  Substance Use Topics  . Smoking status: Never Smoker   . Smokeless tobacco: Not on file  . Alcohol Use: No    FAMILY HISTORY:   Family History  Problem Relation Age  of Onset  . Kidney disease Neg Hx   . Prostate cancer Neg Hx     DRUG ALLERGIES:   Allergies  Allergen Reactions  . Lipitor [Atorvastatin]   . Lisinopril     REVIEW OF SYSTEMS:   Review of Systems  Constitutional: Positive for malaise/fatigue. Negative for fever, chills and weight loss.  HENT: Negative for hearing loss and nosebleeds.   Eyes: Negative for blurred vision, double vision and pain.  Respiratory: Negative for cough, hemoptysis, sputum production, shortness of breath and wheezing.   Cardiovascular: Negative for chest pain, palpitations, orthopnea and leg swelling.  Gastrointestinal: Negative for nausea, vomiting, abdominal pain, diarrhea and constipation.  Genitourinary: Negative for dysuria and hematuria.  Musculoskeletal: Positive for back pain. Negative for myalgias and falls.  Skin: Negative for rash.  Neurological: Positive for speech change (chronic), focal weakness (chronic) and weakness. Negative for dizziness, tremors, sensory change, seizures and headaches.  Endo/Heme/Allergies: Does not bruise/bleed easily.  Psychiatric/Behavioral: Negative for depression and memory loss. The patient is not nervous/anxious.     MEDICATIONS AT HOME:   Prior to Admission medications   Medication Sig Start Date End Date Taking? Authorizing Provider  aspirin 81 MG tablet Take 81 mg by mouth daily.   Yes Historical Provider, MD  carvedilol (COREG) 6.25 MG tablet Take 6.25 mg by mouth 2 (two) times daily with a meal.  01/27/14  Yes  Historical Provider, MD  cetirizine (ZYRTEC) 10 MG tablet TAKE 1 TABLET BY MOUTH AT BEDTIME FOR NASAL DRIP 03/31/15  Yes Historical Provider, MD  dabigatran (PRADAXA) 150 MG CAPS capsule Take 150 mg by mouth 2 (two) times daily.   Yes Historical Provider, MD  digoxin (LANOXIN) 0.25 MG tablet Take 0.25 mg by mouth daily.   Yes Historical Provider, MD  diltiazem (CARDIZEM CD) 240 MG 24 hr capsule Take 120 mg by mouth daily.  12/30/13  Yes Historical  Provider, MD  furosemide (LASIX) 40 MG tablet Take 40 mg by mouth daily.   Yes Historical Provider, MD  LANTUS SOLOSTAR 100 UNIT/ML Solostar Pen Inject 40 Units into the skin daily at 10 pm.  03/23/15  Yes Historical Provider, MD  lisinopril (PRINIVIL,ZESTRIL) 2.5 MG tablet Take 2.5 mg by mouth daily.   Yes Historical Provider, MD  potassium chloride SA (KLOR-CON M20) 20 MEQ tablet Take 20 mEq by mouth daily.   Yes Historical Provider, MD  Blood Glucose Monitoring Suppl (ONE TOUCH ULTRA MINI) W/DEVICE KIT  11/06/13   Historical Provider, MD  ONE TOUCH ULTRA TEST test strip  11/06/13   Historical Provider, MD  Jonetta Speak LANCETS 49F [place]  11/06/13   Historical Provider, MD      VITAL SIGNS:  Blood pressure 174/86, pulse 85, temperature 99.9 F (37.7 C), temperature source Oral, resp. rate 23, height _0  (1.854 m), weight 86.183 kg (190 lb), SpO2 98 %.  PHYSICAL EXAMINATION:  Physical Exam  GENERAL:  81 y.o.-year-old patient lying in the bed with no acute distress.  EYES: Pupils equal, round, reactive to light and accommodation. No scleral icterus. Extraocular muscles intact.  HEENT: Head atraumatic, normocephalic. Oropharynx and nasopharynx clear. No oropharyngeal erythema, moist oral mucosa  NECK:  Supple, no jugular venous distention. No thyroid enlargement, no tenderness.  LUNGS: Normal work of breathing with good air entry bilaterally. Mild expiratory wheezes. CARDIOVASCULAR: S1, S2 normal. No murmurs, rubs, or gallops.  ABDOMEN: Soft, nontender, nondistended. Bowel sounds present. No organomegaly or mass.  EXTREMITIES: No pedal edema, cyanosis, or clubbing. + 2 pedal & radial pulses b/l.   NEUROLOGIC: Slurred speech. Left motor strength 4 x 5 in upper and lower extremity's. Right side 5 over 5.  PSYCHIATRIC: The patient is alert and oriented x 3. Good affect.  SKIN: No obvious rash, lesion, or ulcer.   LABORATORY PANEL:   CBC  Recent Labs Lab 07/27/15 2206  WBC 17.4*  HGB  14.1  HCT 41.7  PLT 270   ------------------------------------------------------------------------------------------------------------------  Chemistries   Recent Labs Lab 07/27/15 2206  NA 138  K 4.0  CL 102  CO2 27  GLUCOSE 221*  BUN 17  CREATININE 1.18  CALCIUM 9.0  AST 117*  ALT 95*  ALKPHOS 155*  BILITOT 1.1   ------------------------------------------------------------------------------------------------------------------  Cardiac Enzymes  Recent Labs Lab 07/27/15 2206  TROPONINI 0.07*   ------------------------------------------------------------------------------------------------------------------  RADIOLOGY:  Dg Chest 2 View  07/27/2015  CLINICAL DATA:  Status post syncope.  Weakness.  Initial encounter. EXAM: CHEST  2 VIEW COMPARISON:  Chest radiograph performed 06/24/2013 FINDINGS: The lungs are well-aerated. Mild peribronchial thickening is noted. There is no evidence of focal opacification, pleural effusion or pneumothorax. The heart is normal in size; the mediastinal contour is within normal limits. No acute osseous abnormalities are seen. IMPRESSION: Mild peribronchial thickening noted.  Lungs otherwise clear. Electronically Signed   By: Garald Balding M.D.   On: 07/27/2015 23:09   Ct Head Wo Contrast  07/27/2015  CLINICAL DATA:  Syncope.  Personal history of stroke. EXAM: CT HEAD WITHOUT CONTRAST TECHNIQUE: Contiguous axial images were obtained from the base of the skull through the vertex without intravenous contrast. COMPARISON:  Head CT 06/24/2013 FINDINGS: The brain shows generalized atrophy. There are extensive chronic ischemic changes affecting the brainstem. No focal cerebellar insult. The cerebral hemispheres show old infarctions in the basal ganglia regions and thalami, right worse than left. No cortical or large vessel territory infarction. No sign of acute infarction, mass lesion, hemorrhage, hydrocephalus or extra-axial collection. The calvarium  is unremarkable. Sinuses, and middle ears are clear. Some fluid in the mastoid air cells on the right. There is atherosclerotic calcification of the major vessels at the base of the brain. IMPRESSION: No acute finding by CT. Atrophy, extensive chronic small-vessel ischemic changes affecting the brainstem, thalami and basal ganglia. Electronically Signed   By: Nelson Chimes M.D.   On: 07/27/2015 23:35     IMPRESSION AND PLAN:   * Pre-syncope/Syncope With history of atrial fibrillation and frequent PVCs on the telemetry patient will be admitted to telemetry floor. Check echocardiogram. Repeat cardiac enzymes. Initial troponin 0.07. Rule out arrhythmias. Consult cardiology. Check urinalysis.  * Acute bronchitis Azithromycin, penicillin and albuterol as needed Repeat WBC in AM  * Hypertension, uncontrolled Order home medications first. IV when necessary medications as needed.  * Diabetes mellitus Sliding scale insulin and diabetic diet  * History of CVA Has baseline slurred speech and is stable  * DVT prophylaxis Patient is on pradaxa   All the records are reviewed and case discussed with ED provider. Management plans discussed with the patient, family and they are in agreement.  CODE STATUS: FULL  TOTAL TIME TAKING CARE OF THIS PATIENT: 45 minutes.    Hillary Bow R M.D on 07/28/2015 at 1:26 AM  Between 7am to 6pm - Pager - [phone]  After 6pm go to [email or web address] - password [email or web address] [place] [facility]  Office  [phone]  CC: Primary care physician; Perrin Maltese, MD     Note: This dictation was prepared with Dragon dictation along with smaller phrase technology. Any transcriptional errors that result from this process are unintentional.

## 2015-07-28 NOTE — Evaluation (Signed)
Physical Therapy Evaluation Patient Details Name: Robert Rojas MRN: [id number] DOB: Feb 12, 1930 Today's Date: 07/28/2015   History of Present Illness  Pt is an 81yo white male with PMH remote CVA, HTN, DM, who lives at home and cares for wife with demetia. Remote stroke resulted in delayed response time , and pt reports hyperhidrosis at baseline. Pt sustained a syncopal episode and was brought in for further workup. Of note Troponins reviewed as: 1.83, 0.07, 0.14; cardiology note suggests conservative management going forward and pt denies CP  upon recepit as well as throughout session.   Clinical Impression  Pt is received semirecumbent in bed upon entry, asleep, but easily rousable, and willing to participate. No acute distress noted, however pt reports he needs to get to the bathroom. Pt is A&Ox3 and pleasant, with garbled speech, which RN reports to be at baseline. Pt strength as screened by functional mobility assessment, demonstrating significant weakness requiring mod assist for all mobility, and demonstrating labored and bradykinetic gait. Session was limited due to bowel incontinence onto floor. Patient presenting with impairment of strength, range of motion, balance, and activity tolerance, limiting ability to perform ADL and mobility tasks at  baseline level of function. Patient will benefit from skilled intervention to address the above impairments and limitations, in order to restore to prior level of function, improve patient safety upon discharge, and to decrease falls risk.       Follow Up Recommendations SNF    Equipment Recommendations  None recommended by PT    Recommendations for Other Services       Precautions / Restrictions Precautions Precautions: None Restrictions Weight Bearing Restrictions: No      Mobility  Bed Mobility Overal bed mobility: Needs Assistance Bed Mobility: Supine to Sit;Sit to Supine     Supine to sit: Mod assist Sit to supine: Mod assist   General bed mobility comments: Difficulty remaining seated at bedside, continues to fall backwards into the bed, requriing mod-max A to remain upright.   Transfers Overall transfer level: Needs assistance Equipment used: Rolling walker (2 wheeled) Transfers: Sit to/from Stand Sit to Stand: Mod assist         General transfer comment: weakness in quads and glute, requires lift assistance, difficulty obtaining TKE bilat.   Ambulation/Gait Ambulation/Gait assistance: Min guard Ambulation Distance (Feet): 15 Feet (bedside to BR, stoping midway due to incontinence episode x2 onto floor. ) Assistive device: Rolling walker (2 wheeled)     Gait velocity interpretation: <1.8 ft/sec, indicative of risk for recurrent falls General Gait Details: veyr weak, slow, guarded, and acautiouswith wide based gait. Demonstrating difficulty managing obstacles in room with RW.   Stairs            Wheelchair Mobility    Modified Rankin (Stroke Patients Only)       Balance Overall balance assessment: History of Falls;Needs assistance Sitting-balance support: Feet supported;Bilateral upper extremity supported Sitting balance-Leahy Scale: Poor       Standing balance-Leahy Scale: Fair                               Pertinent Vitals/Pain Pain Assessment: No/denies pain    Home Living Family/patient expects to be discharged to:: Private residence Living Arrangements: Spouse/significant other Available Help at Discharge: Family (pt is caregiver for wife with dementia. ) Type of Home: Apartment         Home Equipment: Walker - 4 wheels  Prior Function Level of Independence: Needs assistance   Gait / Transfers Assistance Needed: household distance ambulation with RW; reportedly does not ambulate the community.            Hand Dominance        Extremity/Trunk Assessment   Upper Extremity Assessment: Generalized weakness (decreases utility of BUE for  transfering sit to stand, required modA . )           Lower Extremity Assessment: Generalized weakness (Very slow unsteady with gait, wide based support. )      Cervical / Trunk Assessment:  (forward flexed. )  Communication   Communication: Expressive difficulties (Pt presenting with flat affect, garbled, speech, delayed response, etc, which RN reports to be pt's baseline related to remote CVA. )  Cognition Arousal/Alertness: Lethargic (asleep upon entry ) Behavior During Therapy: Flat affect;WFL for tasks assessed/performed Overall Cognitive Status: No family/caregiver present to determine baseline cognitive functioning       Memory:  (Pt is A&Ox4)              General Comments      Exercises        Assessment/Plan    PT Assessment Patient needs continued PT services  PT Diagnosis Difficulty walking;Abnormality of gait;Generalized weakness   PT Problem List Decreased strength;Decreased range of motion;Decreased activity tolerance;Decreased balance;Decreased mobility;Decreased coordination  PT Treatment Interventions DME instruction;Gait training;Stair training;Functional mobility training;Therapeutic activities;Therapeutic exercise;Balance training   PT Goals (Current goals can be found in the Care Plan section) Acute Rehab PT Goals Patient Stated Goal: Offers no goals at this time  Time For Goal Achievement: 08/11/15    Frequency Min 2X/week   Barriers to discharge Inaccessible home environment      Co-evaluation               End of Session Equipment Utilized During Treatment: Gait belt Activity Tolerance: Patient tolerated treatment well;No increased pain;Patient limited by lethargy Patient left: with call bell/phone within reach;with nursing/sitter in room (on [facility] with NA in room. ) Nurse Communication: Mobility status;Other (comment) (hyperhidrosis)         Time: 1426-1500 PT Time Calculation (min) (ACUTE ONLY): 34 min   Charges:   PT  Evaluation $Initial PT Evaluation Tier I: 1 Procedure PT Treatments $Therapeutic Activity: 8-22 mins   PT G Codes:        Delayni Streed C 08-14-15, 3:46 PM  3:49 PM  Etta Grandchild, PT, DPT [place] License # [id number]

## 2015-07-28 NOTE — Progress Notes (Signed)
[place] at [place] NAME: Robert Rojas    MR#:  [id number]  DATE OF BIRTH:  21-Apr-1930  SUBJECTIVE:  CHIEF COMPLAINT:   Chief Complaint  Patient presents with  . Loss of Consciousness   denies any chest pain or other symptoms.  Difficult to interpret with slurred speech  REVIEW OF SYSTEMS:  Review of Systems  Constitutional: Negative for fever, weight loss, malaise/fatigue and diaphoresis.  HENT: Negative for ear discharge, ear pain, hearing loss, nosebleeds, sore throat and tinnitus.   Eyes: Negative for blurred vision and pain.  Respiratory: Negative for cough, hemoptysis, shortness of breath and wheezing.   Cardiovascular: Negative for chest pain, palpitations, orthopnea and leg swelling.  Gastrointestinal: Negative for heartburn, nausea, vomiting, abdominal pain, diarrhea, constipation and blood in stool.  Genitourinary: Negative for dysuria, urgency and frequency.  Musculoskeletal: Negative for myalgias and back pain.  Skin: Negative for itching and rash.  Neurological: Negative for dizziness, tingling, tremors, focal weakness, seizures, weakness and headaches.  Psychiatric/Behavioral: Negative for depression. The patient is not nervous/anxious.    DRUG ALLERGIES:   Allergies  Allergen Reactions  . Lipitor [Atorvastatin]   . Lisinopril    VITALS:  Blood pressure 143/62, pulse 80, temperature 98.9 F (37.2 C), temperature source Oral, resp. rate 18, height 6\' 1"  (1.854 m), weight 88.406 kg (194 lb 14.4 oz), SpO2 92 %. PHYSICAL EXAMINATION:  Physical Exam  Constitutional: He is oriented to person, place, and time and well-developed, well-nourished, and in no distress.  HENT:  Head: Normocephalic and atraumatic.  Eyes: Conjunctivae and EOM are normal. Pupils are equal, round, and reactive to light.  Neck: Normal range of motion. Neck supple. No tracheal deviation present. No thyromegaly present.  Cardiovascular: Normal  rate, regular rhythm and normal heart sounds.   Pulmonary/Chest: Effort normal and breath sounds normal. No respiratory distress. He has no wheezes. He exhibits no tenderness.  Abdominal: Soft. Bowel sounds are normal. He exhibits no distension. There is no tenderness.  Musculoskeletal: Normal range of motion.  Neurological: He is alert and oriented to person, place, and time. No cranial nerve deficit.  Skin: Skin is warm and dry. No rash noted.  Psychiatric: Mood and affect normal.   LABORATORY PANEL:   CBC  Recent Labs Lab 07/27/15 2206  WBC 17.4*  HGB 14.1  HCT 41.7  PLT 270   ------------------------------------------------------------------------------------------------------------------ Chemistries   Recent Labs Lab 07/27/15 2206  NA 138  K 4.0  CL 102  CO2 27  GLUCOSE 221*  BUN 17  CREATININE 1.18  CALCIUM 9.0  AST 117*  ALT 95*  ALKPHOS 155*  BILITOT 1.1   RADIOLOGY:  Dg Chest 2 View  07/27/2015  CLINICAL DATA:  Status post syncope.  Weakness.  Initial encounter. EXAM: CHEST  2 VIEW COMPARISON:  Chest radiograph performed 06/24/2013 FINDINGS: The lungs are well-aerated. Mild peribronchial thickening is noted. There is no evidence of focal opacification, pleural effusion or pneumothorax. The heart is normal in size; the mediastinal contour is within normal limits. No acute osseous abnormalities are seen. IMPRESSION: Mild peribronchial thickening noted.  Lungs otherwise clear. Electronically Signed   By: Garald Balding M.D.   On: 07/27/2015 23:09   Ct Head Wo Contrast  07/27/2015  CLINICAL DATA:  Syncope.  Personal history of stroke. EXAM: CT HEAD WITHOUT CONTRAST TECHNIQUE: Contiguous axial images were obtained from the base of the skull through the vertex without intravenous contrast. COMPARISON:  Head CT 06/24/2013 FINDINGS:  The brain shows generalized atrophy. There are extensive chronic ischemic changes affecting the brainstem. No focal cerebellar insult. The  cerebral hemispheres show old infarctions in the basal ganglia regions and thalami, right worse than left. No cortical or large vessel territory infarction. No sign of acute infarction, mass lesion, hemorrhage, hydrocephalus or extra-axial collection. The calvarium is unremarkable. Sinuses, and middle ears are clear. Some fluid in the mastoid air cells on the right. There is atherosclerotic calcification of the major vessels at the base of the brain. IMPRESSION: No acute finding by CT. Atrophy, extensive chronic small-vessel ischemic changes affecting the brainstem, thalami and basal ganglia. Electronically Signed   By: Nelson Chimes M.D.   On: 07/27/2015 23:35   ASSESSMENT AND PLAN:   * Non-ST elevation MI: Troponins trending up.  Continue Coreg, Cardizem, pravastatin. Cardiology planning cath with elevated troponins but already received pradaxa so can't do it right away.  * Pre-syncope/Syncope With history of atrial fibrillation and frequent PVCs. Check echocardiogram. Rule out arrhythmias.  * Acute bronchitis Continue Azithromycin, and albuterol as needed  * Hypertension, uncontrolled Order home medications first. IV when necessary medications as needed.  * Diabetes mellitus Sliding scale insulin and diabetic diet  * History of CVA Has baseline slurred speech and is stable  * DVT prophylaxis Patient is on pradaxa which is on hold for possible cath     All the records are reviewed and case discussed with Care Management/Social Worker. Management plans discussed with the patient, family (daughter [name] on [phone])  and they are in agreement.  CODE STATUS: DO NOT RESUSCITATE  TOTAL TIME TAKING CARE OF THIS PATIENT: 35 minutes.   More than 50% of the time was spent in counseling/coordination of care: YES  POSSIBLE D/C IN 2-3 DAYS, DEPENDING ON CLINICAL CONDITION.   [facility], Troi Florendo M.D on 07/28/2015 at 1:50 PM  Between 7am to 6pm - Pager - [phone]  After 6pm go to  [email or web address] - password [email or web address] [place] [facility]  Office  [phone]  CC: Primary care physician; Perrin Maltese, MD

## 2015-07-28 NOTE — Progress Notes (Signed)
Pt having terrible hiccups.  MD, Dr. Darvin Neighbours notified.  MD gave verbal order for 5 mg IV reglan.  Will continue to monitor. Jessee Avers

## 2015-07-28 NOTE — Care Management Obs Status (Signed)
[facility] NOTIFICATION   Patient Details  Name: Robert Rojas MRN: [id number] Date of Birth: 06-05-1930   Medicare Observation Status Notification Given:  Yes  Patient admitted under observation.  Presented and explained observation notice.  Patient signed.   Original given to patient and copy placed in medical record.  Copy manually delivered to HIM     Katrina Stack, RN 07/28/2015, 12:24 PM

## 2015-07-28 NOTE — Progress Notes (Signed)
ANTIBIOTIC CONSULT NOTE - INITIAL  Pharmacy Consult for Unasyn Indication: Bacteremia  Allergies  Allergen Reactions  . Lipitor [Atorvastatin]   . Lisinopril     Patient Measurements: Height: 6\' 1"  (185.4 cm) Weight: 194 lb 14.4 oz (88.406 kg) IBW/kg (Calculated) : 79.9  Vital Signs: Temp: 98.9 F (37.2 C) (11/15 1149) Temp Source: Oral (11/15 1149) BP: 143/62 mmHg (11/15 1149) Pulse Rate: 76 (11/15 1536) Intake/Output from previous day: 11/14 0701 - 11/15 0700 In: -  Out: 100 [Urine:100] Intake/Output from this shift: Total I/O In: 123 [P.O.:120; I.V.:3] Out: 100 [Urine:100]  Labs:  Recent Labs  07/27/15 2206  WBC 17.4*  HGB 14.1  PLT 270  CREATININE 1.18   Estimated Creatinine Clearance: 53.6 mL/min (by C-G formula based on Cr of 1.18). No results for input(s): VANCOTROUGH, VANCOPEAK, VANCORANDOM, GENTTROUGH, GENTPEAK, GENTRANDOM, TOBRATROUGH, TOBRAPEAK, TOBRARND, AMIKACINPEAK, AMIKACINTROU, AMIKACIN in the last 72 hours.   Microbiology: Recent Results (from the past 720 hour(s))  Culture, blood (routine x 2)     Status: None (Preliminary result)   Collection Time: 07/28/15  2:51 AM  Result Value Ref Range Status   Specimen Description BLOOD  Final   Special Requests NONE  Final   Culture  Setup Time GRAM NEGATIVE RODS  Final   Culture   Final    GRAM NEGATIVE RODS ANAEROBIC BOTTLE ONLY CRITICAL RESULT CALLED TO, READ BACK BY AND VERIFIED WITH: RN [facility] LEE 07/28/15 1546    Report Status PENDING  Incomplete  Culture, blood (routine x 2)     Status: None (Preliminary result)   Collection Time: 07/28/15  2:52 AM  Result Value Ref Range Status   Specimen Description BLOOD  Final   Special Requests NONE  Final   Culture  Setup Time GRAM NEGATIVE RODS  Final   Culture   Final    GRAM NEGATIVE RODS IN BOTH AEROBIC AND ANAEROBIC BOTTLES CRITICAL RESULT CALLED TO, READ BACK BY AND VERIFIED WITH: RN [facility] LEE 07/28/15 1346    Report Status PENDING  Incomplete     Medical History: Past Medical History  Diagnosis Date  . Coronary artery disease   . Essential hypertension   . Hyperlipemia   . Type 2 diabetes mellitus ([place])   . GERD (gastroesophageal reflux disease)   . TIA (transient ischemic attack)   . BCC (basal cell carcinoma)   . Spinal stenosis   . MI (myocardial infarction) ([place]) 01/2011  . CVA (cerebral infarction)     left sided  . Arthralgia     left knee/patella/tibia/fibula  . Prostatitis   . Erectile dysfunction     Medications:  Scheduled:  . ampicillin-sulbactam (UNASYN) IV  1.5 g Intravenous Q6H  . antiseptic oral rinse  7 mL Mouth Rinse BID  . aspirin EC  81 mg Oral Daily  . azithromycin  500 mg Oral Daily  . carvedilol  6.25 mg Oral BID WC  . digoxin  0.25 mg Oral Daily  . diltiazem  180 mg Oral Daily  . docusate sodium  100 mg Oral BID  . enoxaparin (LOVENOX) injection  1 mg/kg Subcutaneous Q12H  . fluticasone  1 spray Each Nare Daily  . furosemide  40 mg Oral Daily  . gemfibrozil  600 mg Oral BID AC  . insulin aspart  0-5 Units Subcutaneous QHS  . insulin aspart  0-9 Units Subcutaneous TID WC  . insulin glargine  20 Units Subcutaneous QHS  . potassium chloride SA  20 mEq Oral Daily  .  predniSONE  50 mg Oral Q breakfast  . rosuvastatin  20 mg Oral q1800  . sertraline  50 mg Oral Daily  . sodium chloride  3 mL Intravenous Q12H   Infusions:    Assessment: 81 y/o M ordered empiric Unasyn for GNR bacteremia. Patient currently on azithromycin for acute bronchitis.   Goal of Therapy:  Resolution of infection  Plan:  Will begin Unasyn 1.5 g iv q 6 hours. Will continue to follow renal function and culture results.   Ulice Dash D 07/28/2015,4:13 PM

## 2015-07-28 NOTE — Progress Notes (Signed)
*  PRELIMINARY RESULTS* Echocardiogram 2D Echocardiogram has been performed.  Laqueta Jean Hege 07/28/2015, 9:24 AM

## 2015-07-28 NOTE — Progress Notes (Signed)
Notified by lab that third troponin came back at 1.83. Dr. Humphrey Rolls notified. MD wants to do cardiac catheterization. Patient is on pradaxa which he took this AM. Patient has to be off pradaxa for a couple of days per cardiology, will schedule for cath then. Discontinued pradaxa and started on lovenox. Patient is in no distress and has no chest pain at this time.

## 2015-07-28 NOTE — Consult Note (Signed)
ANTICOAGULATION CONSULT NOTE - Initial Consult  Pharmacy Consult for lovenox Indication: chest pain/ACS  Allergies  Allergen Reactions  . Lipitor [Atorvastatin]   . Lisinopril     Patient Measurements: Height: 6\' 1"  (185.4 cm) Weight: 194 lb 14.4 oz (88.406 kg) IBW/kg (Calculated) : 79.9 Heparin Dosing Weight:   Vital Signs: Temp: 98.9 F (37.2 C) (11/15 1149) Temp Source: Oral (11/15 1149) BP: 143/62 mmHg (11/15 1149) Pulse Rate: 80 (11/15 1149)  Labs:  Recent Labs  07/27/15 1121 07/27/15 2206 07/28/15 0444  HGB  --  14.1  --   HCT  --  41.7  --   PLT  --  270  --   CREATININE  --  1.18  --   TROPONINI 1.83* 0.07* 0.14*    Estimated Creatinine Clearance: 53.6 mL/min (by C-G formula based on Cr of 1.18).   Medical History: Past Medical History  Diagnosis Date  . Coronary artery disease   . Essential hypertension   . Hyperlipemia   . Type 2 diabetes mellitus ([place])   . GERD (gastroesophageal reflux disease)   . TIA (transient ischemic attack)   . BCC (basal cell carcinoma)   . Spinal stenosis   . MI (myocardial infarction) ([place]) 01/2011  . CVA (cerebral infarction)     left sided  . Arthralgia     left knee/patella/tibia/fibula  . Prostatitis   . Erectile dysfunction     Medications:  Scheduled:  . antiseptic oral rinse  7 mL Mouth Rinse BID  . aspirin EC  81 mg Oral Daily  . azithromycin  500 mg Oral Daily  . carvedilol  6.25 mg Oral BID WC  . digoxin  0.25 mg Oral Daily  . diltiazem  180 mg Oral Daily  . docusate sodium  100 mg Oral BID  . enoxaparin (LOVENOX) injection  1 mg/kg Subcutaneous Q12H  . fluticasone  1 spray Each Nare Daily  . furosemide  40 mg Oral Daily  . gemfibrozil  600 mg Oral BID AC  . insulin aspart  0-5 Units Subcutaneous QHS  . insulin aspart  0-9 Units Subcutaneous TID WC  . linagliptin  5 mg Oral Daily  . potassium chloride SA  20 mEq Oral Daily  . pravastatin  40 mg Oral Daily  . predniSONE  50 mg Oral Q breakfast   . sertraline  50 mg Oral Daily  . sodium chloride  3 mL Intravenous Q12H    Assessment: Pt is a 81 year old male with a NSEMI. Pt took pradaxa last at 0830 this morning. Pharmacy is consulted to dose lovenox for NSTEMI  Goal of Therapy:  Anti-Xa level 0.6-1 units/ml 4hrs after LMWH dose given Monitor platelets by anticoagulation protocol: Yes   Plan:  Lovenox 1mg /kg q 12 hours. Start atleast 12 hours after dabigatran dose. 1900 this evening. Pharmacy to continue to monitor renal function/cbc  Melissa D Maccia 07/28/2015,2:13 PM

## 2015-07-28 NOTE — Progress Notes (Signed)
Fourth troponin has come back at 5.21. Prime doc notified, Dr. Margaretmary Eddy. MD stated to page cardiology. Dr. Humphrey Rolls was able to be reached and notified. MD stated cardiac cath will be done on Thursday. Confirmed patient is on lovenox at 90mg . Patient is asymptomatic at this time. Will pass on to night shift.  Third troponin for 11/15 at 1121 was entered in on the wrong day for 11/14. Lab has been notified twice to two different lab technicians to change this in the system. Troponin order should read 0.07, 0.14. 1.83, 5.21.

## 2015-07-28 NOTE — H&P (Signed)
Robert Rojas is a 81 y.o. male  [id number]  Primary Cardiologist: Neoma Laming Reason for Consultation: Elevated troponin  HPI: This is a 81 year old white male with a history of CVA hypertension diabetes coronary artery disease presented to the emergency room after losing consciousness. Patient denies any chest pain shortness of breath but admitted to some weakness.   Review of Systems: No chest pain no shortness of breath just feeling weak    Past Medical History  Diagnosis Date  . Coronary artery disease   . Essential hypertension   . Hyperlipemia   . Type 2 diabetes mellitus ([place])   . GERD (gastroesophageal reflux disease)   . TIA (transient ischemic attack)   . BCC (basal cell carcinoma)   . Spinal stenosis   . MI (myocardial infarction) ([place]) 01/2011  . CVA (cerebral infarction)     left sided  . Arthralgia     left knee/patella/tibia/fibula  . Prostatitis   . Erectile dysfunction     Medications Prior to Admission  Medication Sig Dispense Refill  . aspirin 81 MG tablet Take 81 mg by mouth daily.    . carvedilol (COREG) 6.25 MG tablet Take 6.25 mg by mouth 2 (two) times daily with a meal.     . cetirizine (ZYRTEC) 10 MG tablet TAKE 1 TABLET BY MOUTH AT BEDTIME FOR NASAL DRIP  5  . dabigatran (PRADAXA) 150 MG CAPS capsule Take 150 mg by mouth 2 (two) times daily.    . digoxin (LANOXIN) 0.25 MG tablet Take 0.25 mg by mouth daily.    Marland Kitchen diltiazem (CARDIZEM CD) 240 MG 24 hr capsule Take 120 mg by mouth daily.     . furosemide (LASIX) 40 MG tablet Take 40 mg by mouth daily.    Marland Kitchen LANTUS SOLOSTAR 100 UNIT/ML Solostar Pen Inject 40 Units into the skin daily at 10 pm.     . lisinopril (PRINIVIL,ZESTRIL) 2.5 MG tablet Take 2.5 mg by mouth daily.    . potassium chloride SA (KLOR-CON M20) 20 MEQ tablet Take 20 mEq by mouth daily.    . Blood Glucose Monitoring Suppl (ONE TOUCH ULTRA MINI) W/DEVICE KIT     . ONE TOUCH ULTRA TEST test strip     . ONETOUCH DELICA LANCETS 32D MISC         . antiseptic oral rinse  7 mL Mouth Rinse BID  . aspirin EC  81 mg Oral Daily  . azithromycin  500 mg Oral Daily  . carvedilol  6.25 mg Oral BID WC  . dabigatran  150 mg Oral BID  . digoxin  0.25 mg Oral Daily  . diltiazem  180 mg Oral Daily  . docusate sodium  100 mg Oral BID  . fluticasone  1 spray Each Nare Daily  . furosemide  40 mg Oral Daily  . gemfibrozil  600 mg Oral BID AC  . insulin aspart  0-5 Units Subcutaneous QHS  . insulin aspart  0-9 Units Subcutaneous TID WC  . linagliptin  5 mg Oral Daily  . potassium chloride SA  20 mEq Oral Daily  . pravastatin  40 mg Oral Daily  . predniSONE  50 mg Oral Q breakfast  . sertraline  50 mg Oral Daily  . sodium chloride  3 mL Intravenous Q12H    Infusions:    Allergies  Allergen Reactions  . Lipitor [Atorvastatin]   . Lisinopril     Social History   Social History  . Marital Status: Married  Spouse Name: N/A  . Number of Children: N/A  . Years of Education: N/A   Occupational History  . Not on file.   Social History Main Topics  . Smoking status: Never Smoker   . Smokeless tobacco: Not on file  . Alcohol Use: No  . Drug Use: No  . Sexual Activity: Not on file   Other Topics Concern  . Not on file   Social History Narrative    Family History  Problem Relation Age of Onset  . Kidney disease Neg Hx   . Prostate cancer Neg Hx     PHYSICAL EXAM: Filed Vitals:   07/28/15 0405  BP: 174/71  Pulse: 91  Temp: 98.6 F (37 C)  Resp: 18     Intake/Output Summary (Last 24 hours) at 07/28/15 0909 Last data filed at 07/28/15 2706  Gross per 24 hour  Intake      3 ml  Output    200 ml  Net   -197 ml    General:  Well appearing. No respiratory difficulty HEENT: normal Neck: supple. no JVD. Carotids 2+ bilat; no bruits. No lymphadenopathy or thryomegaly appreciated. Cor: PMI nondisplaced. Regular rate & rhythm. No rubs, gallops or murmurs. Lungs: clear Abdomen: soft, nontender,  nondistended. No hepatosplenomegaly. No bruits or masses. Good bowel sounds. Extremities: no cyanosis, clubbing, rash, edema Neuro: alert & oriented x 3, cranial nerves grossly intact. moves all 4 extremities w/o difficulty. Affect pleasant.  ECG: Normal sinus rhythm nonspecific ST-T changes no acute changes  Results for orders placed or performed during the hospital encounter of 07/27/15 (from the past 24 hour(s))  CBC with Differential/Platelet     Status: Abnormal   Collection Time: 07/27/15 10:06 PM  Result Value Ref Range   WBC 17.4 (H) 3.8 - 10.6 K/uL   RBC 4.53 4.40 - 5.90 MIL/uL   Hemoglobin 14.1 13.0 - 18.0 g/dL   HCT 41.7 40.0 - 52.0 %   MCV 92.1 80.0 - 100.0 fL   MCH 31.1 26.0 - 34.0 pg   MCHC 33.8 32.0 - 36.0 g/dL   RDW 15.2 (H) 11.5 - 14.5 %   Platelets 270 150 - 440 K/uL   Neutrophils Relative % 90 %   Neutro Abs 15.5 (H) 1.4 - 6.5 K/uL   Lymphocytes Relative 4 %   Lymphs Abs 0.7 (L) 1.0 - 3.6 K/uL   Monocytes Relative 5 %   Monocytes Absolute 0.9 0.2 - 1.0 K/uL   Eosinophils Relative 1 %   Eosinophils Absolute 0.1 0 - 0.7 K/uL   Basophils Relative 0 %   Basophils Absolute 0.1 0 - 0.1 K/uL  Comprehensive metabolic panel     Status: Abnormal   Collection Time: 07/27/15 10:06 PM  Result Value Ref Range   Sodium 138 135 - 145 mmol/L   Potassium 4.0 3.5 - 5.1 mmol/L   Chloride 102 101 - 111 mmol/L   CO2 27 22 - 32 mmol/L   Glucose, Bld 221 (H) 65 - 99 mg/dL   BUN 17 6 - 20 mg/dL   Creatinine, Ser 1.18 0.61 - 1.24 mg/dL   Calcium 9.0 8.9 - 10.3 mg/dL   Total Protein 7.7 6.5 - 8.1 g/dL   Albumin 4.0 3.5 - 5.0 g/dL   AST 117 (H) 15 - 41 U/L   ALT 95 (H) 17 - 63 U/L   Alkaline Phosphatase 155 (H) 38 - 126 U/L   Total Bilirubin 1.1 0.3 - 1.2 mg/dL   GFR calc  non Af Amer 55 (L) >60 mL/min   GFR calc Af Amer >60 >60 mL/min   Anion gap 9 5 - 15  Troponin I     Status: Abnormal   Collection Time: 07/27/15 10:06 PM  Result Value Ref Range   Troponin I 0.07 (H)  <0.031 ng/mL  Culture, blood (routine x 2)     Status: None (Preliminary result)   Collection Time: 07/28/15  2:51 AM  Result Value Ref Range   Specimen Description BLOOD    Special Requests NONE    Culture NO GROWTH < 12 HOURS    Report Status PENDING   Culture, blood (routine x 2)     Status: None (Preliminary result)   Collection Time: 07/28/15  2:52 AM  Result Value Ref Range   Specimen Description BLOOD    Special Requests NONE    Culture NO GROWTH < 12 HOURS    Report Status PENDING   Troponin I     Status: Abnormal   Collection Time: 07/28/15  4:44 AM  Result Value Ref Range   Troponin I 0.14 (H) <0.031 ng/mL  Glucose, capillary     Status: Abnormal   Collection Time: 07/28/15  8:03 AM  Result Value Ref Range   Glucose-Capillary 250 (H) 65 - 99 mg/dL   Dg Chest 2 View  07/27/2015  CLINICAL DATA:  Status post syncope.  Weakness.  Initial encounter. EXAM: CHEST  2 VIEW COMPARISON:  Chest radiograph performed 06/24/2013 FINDINGS: The lungs are well-aerated. Mild peribronchial thickening is noted. There is no evidence of focal opacification, pleural effusion or pneumothorax. The heart is normal in size; the mediastinal contour is within normal limits. No acute osseous abnormalities are seen. IMPRESSION: Mild peribronchial thickening noted.  Lungs otherwise clear. Electronically Signed   By: Garald Balding M.D.   On: 07/27/2015 23:09   Ct Head Wo Contrast  07/27/2015  CLINICAL DATA:  Syncope.  Personal history of stroke. EXAM: CT HEAD WITHOUT CONTRAST TECHNIQUE: Contiguous axial images were obtained from the base of the skull through the vertex without intravenous contrast. COMPARISON:  Head CT 06/24/2013 FINDINGS: The brain shows generalized atrophy. There are extensive chronic ischemic changes affecting the brainstem. No focal cerebellar insult. The cerebral hemispheres show old infarctions in the basal ganglia regions and thalami, right worse than left. No cortical or large vessel  territory infarction. No sign of acute infarction, mass lesion, hemorrhage, hydrocephalus or extra-axial collection. The calvarium is unremarkable. Sinuses, and middle ears are clear. Some fluid in the mastoid air cells on the right. There is atherosclerotic calcification of the major vessels at the base of the brain. IMPRESSION: No acute finding by CT. Atrophy, extensive chronic small-vessel ischemic changes affecting the brainstem, thalami and basal ganglia. Electronically Signed   By: Nelson Chimes M.D.   On: 07/27/2015 23:35     ASSESSMENT AND PLAN: Syncopal episode etiology is unclear there were several some PVCs on the monitor with mildly elevated troponin and no chest pain. Patient has history of having dementia and CVA and is a DO NOT RESUSCITATE. Advise conservative treatment but will get echocardiogram to look at wall motion. No aggressive treatment is recommended.  Britain Saber A

## 2015-07-28 NOTE — Progress Notes (Signed)
Pt's skin assessed by Lanette Hampshire.  Bottom red but blanchable, as well as elbows.  Pts speech is garbled from previous stroke- but is alert and oriented.  Pt takes pills whole in applesauce.  Pt is very worried about his wife who is home alone- she has dementia.  He is her care giver.  Will continue to monitor. Jessee Avers

## 2015-07-28 NOTE — Care Management (Signed)
Spoke with patient's daughter [name].  Confirmed that patient's wife is the primary caregiver of the patient and not the other way around.  Wife does have dementia  but  sx are very well controlled with medication.  Patient has been falling at home.    The couple does not have financial resources to pay for in home companion services.  Patient was admitted under observation but now patient has ruled in for nstemi with troponin  elevated 1.83.  These results are documented backwards under results.  The troponins are trending up but the third troponin of 1.83 is documented as the first one.  Lab and attending aware.  Daughter would rather patient and wife stay together in the home.  Hospice has been mentioned as resource for increased in home services.  Discussed with [name] that patient would have to meet  hospice criteria and the service does not provide round the clock in home care.      Agency preference would be [employer].  Referral made to nurse liaison.  If patient does not qualify, will refer to home health SN PT Aide and SW.  Informed that cardiology is discussing cardiac cath due to troponin but patient must be off the pradaxa. Not sure if cardiology has dicussed this with patient's daughter

## 2015-07-28 NOTE — Progress Notes (Signed)
Dr. Manuella Ghazi has been notified of patient's positive blood cultures with gram negative rods. Dr. Ola Spurr has been on the floor and was notified of infectious disease consult. Antibiotics ordered.

## 2015-07-28 NOTE — Progress Notes (Signed)
MD, Dr. Darvin Neighbours notified of second trop 0.14.  Continue to monitor  Robert Rojas

## 2015-07-29 LAB — URINALYSIS COMPLETE WITH MICROSCOPIC (ARMC ONLY)
BACTERIA UA: NONE SEEN
Bilirubin Urine: NEGATIVE
Hgb urine dipstick: NEGATIVE
Ketones, ur: NEGATIVE mg/dL
Leukocytes, UA: NEGATIVE
Nitrite: NEGATIVE
PROTEIN: NEGATIVE mg/dL
Specific Gravity, Urine: 1.022 (ref 1.005–1.030)
pH: 5 (ref 5.0–8.0)

## 2015-07-29 LAB — GLUCOSE, CAPILLARY
GLUCOSE-CAPILLARY: 219 mg/dL — AB (ref 65–99)
GLUCOSE-CAPILLARY: 305 mg/dL — AB (ref 65–99)
GLUCOSE-CAPILLARY: 291 mg/dL — AB (ref 65–99)

## 2015-07-29 LAB — CBC
HEMATOCRIT: 41 % (ref 40.0–52.0)
Hemoglobin: 13.9 g/dL (ref 13.0–18.0)
MCH: 31.1 pg (ref 26.0–34.0)
MCHC: 33.9 g/dL (ref 32.0–36.0)
MCV: 91.8 fL (ref 80.0–100.0)
PLATELETS: 252 10*3/uL (ref 150–440)
RBC: 4.46 MIL/uL (ref 4.40–5.90)
RDW: 15.3 % — AB (ref 11.5–14.5)
WBC: 17.6 10*3/uL — AB (ref 3.8–10.6)

## 2015-07-29 LAB — BASIC METABOLIC PANEL
ANION GAP: 8 (ref 5–15)
BUN: 22 mg/dL — ABNORMAL HIGH (ref 6–20)
CALCIUM: 8.8 mg/dL — AB (ref 8.9–10.3)
CO2: 24 mmol/L (ref 22–32)
CREATININE: 1.11 mg/dL (ref 0.61–1.24)
Chloride: 106 mmol/L (ref 101–111)
GFR, EST NON AFRICAN AMERICAN: 59 mL/min — AB (ref >60)
Glucose, Bld: 271 mg/dL — ABNORMAL HIGH (ref 65–99)
Potassium: 3.7 mmol/L (ref 3.5–5.1)
SODIUM: 138 mmol/L (ref 135–145)

## 2015-07-29 LAB — C DIFFICILE QUICK SCREEN W PCR REFLEX
C DIFFICILE (CDIFF) INTERP: NEGATIVE
C DIFFICILE (CDIFF) TOXIN: NEGATIVE
C DIFFICLE (CDIFF) ANTIGEN: NEGATIVE

## 2015-07-29 LAB — TROPONIN I: Troponin I: 6.37 ng/mL — ABNORMAL HIGH (ref <0.031)

## 2015-07-29 LAB — HEMOGLOBIN A1C: HEMOGLOBIN A1C: 7 % — AB (ref 4.0–6.0)

## 2015-07-29 MED ORDER — INSULIN GLARGINE 100 UNIT/ML ~~LOC~~ SOLN
25.0000 [IU] | Freq: Every day | SUBCUTANEOUS | Status: DC
  Administered 2015-07-29 – 2015-07-30 (×2): 25 [IU] via SUBCUTANEOUS
  Filled 2015-07-29 (×3): qty 0.25

## 2015-07-29 NOTE — Care Management (Signed)
Positive blood cultures and troponin up to 6.35.  ID has been consulted

## 2015-07-29 NOTE — Progress Notes (Signed)
Patient has 5 beat of svt , DR Manuella Ghazi was informed , labs order for am level , will continue to monitor

## 2015-07-29 NOTE — Progress Notes (Signed)
Inpatient Diabetes Program Recommendations  AACE/ADA: New Consensus Statement on Inpatient Glycemic Control (2015)  Target Ranges:  Prepandial:   less than 140 mg/dL      Peak postprandial:   less than 180 mg/dL (1-2 hours)      Critically ill patients:  140 - 180 mg/dL  Results for KAENON, FUHRMEISTER (MRN [id number]) as of 07/29/2015 07:52  Ref. Range 07/28/2015 08:03 07/28/2015 11:46 07/28/2015 16:40 07/28/2015 20:59 07/29/2015 07:44  Glucose-Capillary Latest Ref Range: 65-99 mg/dL 250 (H) 286 (H) 304 (H) 324 (H) 219 (H)   Review of Glycemic Control  Diabetes history: DM2 Outpatient Diabetes medications: Lantus 40 units Q10PM Current orders for Inpatient glycemic control: Lantus 20 units QHS, Novolog 0-9 units TID with meals, Novolog 0-5 units HS  Inpatient Diabetes Program Recommendations: Insulin - Basal: Please consider increasing Lantus to 25 units QHS (based on 85 kg x 0.3 units). Insulin - Meal Coverage: If patient is eating at least 50% of meals and steroids are continued, please consider ordering Novolog 4 units TID with meals for meal coverage. HgbA1C: A1C is in process.  Thanks, Barnie Alderman, RN, MSN, CDE Diabetes Coordinator Inpatient Diabetes Program [phone] (Team Pager from [place] to [place]) [phone] ([facility] office) [phone] [facility] office) [phone] [facility] office)

## 2015-07-29 NOTE — Progress Notes (Signed)
MD, Marcille Blanco aware of new troponin level of 6.37 no new orders received.

## 2015-07-29 NOTE — Progress Notes (Signed)
[place] [facility] [place] NAME: Robert Rojas    MR#:  [id number]  DATE OF BIRTH:  August 17, 1930  SUBJECTIVE:  CHIEF COMPLAINT:   Chief Complaint  Patient presents with  . Loss of Consciousness  about same, troponin peaked upto 6.37, blood c/s growing GNR  REVIEW OF SYSTEMS:  Review of Systems  Constitutional: Negative for fever, weight loss, malaise/fatigue and diaphoresis.  HENT: Negative for ear discharge, ear pain, hearing loss, nosebleeds, sore throat and tinnitus.   Eyes: Negative for blurred vision and pain.  Respiratory: Negative for cough, hemoptysis, shortness of breath and wheezing.   Cardiovascular: Negative for chest pain, palpitations, orthopnea and leg swelling.  Gastrointestinal: Negative for heartburn, nausea, vomiting, abdominal pain, diarrhea, constipation and blood in stool.  Genitourinary: Negative for dysuria, urgency and frequency.  Musculoskeletal: Negative for myalgias and back pain.  Skin: Negative for itching and rash.  Neurological: Negative for dizziness, tingling, tremors, focal weakness, seizures, weakness and headaches.  Psychiatric/Behavioral: Negative for depression. The patient is not nervous/anxious.    DRUG ALLERGIES:   Allergies  Allergen Reactions  . Lipitor [Atorvastatin]   . Lisinopril    VITALS:  Blood pressure 135/52, pulse 71, temperature 98.2 F (36.8 C), temperature source Oral, resp. rate 18, height 6\' 1"  (1.854 m), weight 85.322 kg (188 lb 1.6 oz), SpO2 94 %. PHYSICAL EXAMINATION:  Physical Exam  Constitutional: He is oriented to person, place, and time and well-developed, well-nourished, and in no distress.  HENT:  Head: Normocephalic and atraumatic.  Eyes: Conjunctivae and EOM are normal. Pupils are equal, round, and reactive to light.  Neck: Normal range of motion. Neck supple. No tracheal deviation present. No thyromegaly present.  Cardiovascular: Normal rate, regular rhythm and  normal heart sounds.   Pulmonary/Chest: Effort normal and breath sounds normal. No respiratory distress. He has no wheezes. He exhibits no tenderness.  Abdominal: Soft. Bowel sounds are normal. He exhibits no distension. There is no tenderness.  Musculoskeletal: Normal range of motion.  Neurological: He is alert and oriented to person, place, and time. No cranial nerve deficit.  Skin: Skin is warm and dry. No rash noted.  Psychiatric: Mood and affect normal.   LABORATORY PANEL:   CBC  Recent Labs Lab 07/29/15 0253  WBC 17.6*  HGB 13.9  HCT 41.0  PLT 252   ------------------------------------------------------------------------------------------------------------------ Chemistries   Recent Labs Lab 07/27/15 2206 07/29/15 0253  NA 138 138  K 4.0 3.7  CL 102 106  CO2 27 24  GLUCOSE 221* 271*  BUN 17 22*  CREATININE 1.18 1.11  CALCIUM 9.0 8.8*  AST 117*  --   ALT 95*  --   ALKPHOS 155*  --   BILITOT 1.1  --    RADIOLOGY:  No results found. ASSESSMENT AND PLAN:   * Non-ST elevation MI: d/w daughter who prefers conservative mgmt and no cath. Continue Coreg, Cardizem, pravastatin.   * GNR bacteremia: on Unasyn. ID following, likely abd source. C.diff neg.  * Pre-syncope/Syncope With history of atrial fibrillation and frequent PVCs. Echocardiogram showing Severe LV systolic dysfunction and wall motion abnormalities due to CAD. EF 35%  * Acute bronchitis Continue Azithromycin, and albuterol as needed  * Hypertension, uncontrolled Order home medications first. IV when necessary medications as needed.  * Diabetes mellitus Sliding scale insulin and diabetic diet  * History of CVA Has baseline slurred speech and is stable  * DVT prophylaxis Patient is on pradaxa which is on  hold but i guess we can resume it as IF no cath planned for now.  PT recommends STR/SNF where he'll be D/Ced once bed available - may be Friday if ID can narrow abx. If he improves he can go  home from rehab if not, may consider palliative care and Hospice while at the facility  All the records are reviewed and case discussed with Care Management/Social Worker. Management plans discussed with the patient, family (daughter [name] on [phone])  and they are in agreement.  CODE STATUS: DO NOT RESUSCITATE  TOTAL TIME TAKING CARE OF THIS PATIENT: 35 minutes.   More than 50% of the time was spent in counseling/coordination of care: YES (daughter [name] on [phone])  POSSIBLE D/C IN 2-3 DAYS, DEPENDING ON CLINICAL CONDITION and infection w/up   Max Sane M.D on 07/29/2015 at 1:54 PM  Between 7am to 6pm - Pager - [phone]  After 6pm go to [email or web address] - password [email or web address] [facility]  Office  [phone]  CC: Primary care physician; Perrin Maltese, MD

## 2015-07-29 NOTE — Evaluation (Signed)
Occupational Therapy Evaluation Patient Details Name: Robert Rojas MRN: [id number] DOB: 30-Jan-1930 Today's Date: 07/29/2015    History of Present Illness Patient is a 81 yo male who was admitted to [facility] after sycopal episode.  He lives at home with his wife who has mild dementia in an apartment with an elevator to access.  He reports he has a rolling walker, tub/shower with transfer bench, shower curtain, regular toilet.  He is currently retired.  His wife is able to help him with getting dressed at times but he was able to complete his bath independently prior to admission.  He is unable to drive due to a previous CVA about 1 year ago.  His has a daughter local who helps when she can.  Patient now presents with muscle weakness, decreased ability to perform transfers, functional mobility and self care tasks.  He would benefit from skilled OT to maximize his safety and independence in daily tasks.     Clinical Impression   Patient is a 81 yo male who was admitted to [facility] after sycopal episode.  He lives at home with his wife who has mild dementia in an apartment with an elevator to access.  He reports he has a rolling walker, tub/shower with transfer bench, shower curtain, regular toilet.  He is currently retired.  His wife is able to help him with getting dressed at times but he was able to complete his bath independently prior to admission.  He is unable to drive due to a previous CVA about 1 year ago.  His has a daughter local who helps when she can.  Patient now presents with muscle weakness, decreased ability to perform transfers, functional mobility and self care tasks.  He would benefit from skilled OT to maximize his safety and independence in daily tasks.      Follow Up Recommendations  SNF    Equipment Recommendations       Recommendations for Other Services       Precautions / Restrictions Precautions Precautions: Fall Restrictions Weight Bearing Restrictions: No       Mobility Bed Mobility Overal bed mobility: Needs Assistance Bed Mobility: Supine to Sit;Sit to Supine     Supine to sit: Mod assist Sit to supine: Mod assist      Transfers Overall transfer level: Needs assistance Equipment used: Rolling walker (2 wheeled) Transfers: Sit to/from Stand Sit to Stand: Mod assist              Balance Overall balance assessment: History of Falls;Needs assistance Sitting-balance support: Feet supported;Bilateral upper extremity supported Sitting balance-Leahy Scale: Poor                                      ADL Overall ADL's : Needs assistance/impaired Eating/Feeding: Set up   Grooming: Set up;Min guard   Upper Body Bathing: Set up;Minimal assitance   Lower Body Bathing: Set up;Moderate assistance   Upper Body Dressing : Set up;Minimal assistance   Lower Body Dressing: Set up;Moderate assistance   Toilet Transfer: Moderate assistance                   Vision     Perception     Praxis      Pertinent Vitals/Pain       Hand Dominance Left   Extremity/Trunk Assessment Upper Extremity Assessment Upper Extremity Assessment: Generalized weakness   Lower Extremity Assessment Lower Extremity  Assessment: Defer to PT evaluation       Communication Communication Communication: Expressive difficulties   Cognition Arousal/Alertness: Awake/alert Behavior During Therapy: Flat affect;WFL for tasks assessed/performed Overall Cognitive Status: Within Functional Limits for tasks assessed                     General Comments   Patient seen for UB grooming and oral care tasks this date, self feeding of snack with set up .    Exercises       Shoulder Instructions      Home Living Family/patient expects to be discharged to:: Private residence Living Arrangements: Spouse/significant other Available Help at Discharge: Family Type of Home: Apartment       Home Layout: One level      Bathroom Shower/Tub: Tub/shower unit;Curtain Shower/tub characteristics: Architectural technologist: Standard Bathroom Accessibility: Yes   Home Equipment: Environmental consultant - 4 wheels          Prior Functioning/Environment Level of Independence: Needs assistance  Gait / Transfers Assistance Needed: household distance ambulation with RW; reportedly does not ambulate the community.           OT Diagnosis: Generalized weakness;Other (comment);Cognitive deficits (decreased ability to perform self care tasks.)   OT Problem List: Decreased strength;Impaired balance (sitting and/or standing);Decreased cognition;Decreased range of motion;Decreased activity tolerance;Decreased knowledge of use of DME or AE   OT Treatment/Interventions: Self-care/ADL training;Therapeutic exercise;Patient/family education;Balance training;DME and/or AE instruction;Cognitive remediation/compensation    OT Goals(Current goals can be found in the care plan section) Acute Rehab OT Goals Patient Stated Goal: wants to be able to take care of himself and his wife OT Goal Formulation: With patient Time For Goal Achievement: 08/07/15 Potential to Achieve Goals: Good  OT Frequency: Min 1X/week   Barriers to D/C:            Co-evaluation              End of Session    Activity Tolerance: Patient tolerated treatment well Patient left: in bed;with call bell/phone within reach;with bed alarm set   Time: [id number] OT Time Calculation (min): 32 min Charges:  OT General Charges $OT Visit: 1 Procedure OT Evaluation $Initial OT Evaluation Tier I: 1 Procedure OT Treatments $Self Care/Home Management : 8-22 mins G-Codes:    Lovett,Amy 2015/08/11, 3:46 PM

## 2015-07-29 NOTE — Care Management (Signed)
Per previous conversation with patient's daughter, it had been  stated by daughter that if at all possible would want for patient to return home with home health services- so could keep patient and his wife together.  Now condition has gotten a little more complicated as patient has ruled in for nstemi and now with positive blood cultures.  Patient is is very weak and discussed that it may be a better plan to pursue skilled nursing.  Unsure of of plan for antibiotics at present time.   Attending indicates if it is med that can be give orally,and  patient remains stable, could anticipate discharge with the next 24 - 48 hours.  He says that daughter has declined cardiac cath for now.  Daughter plans to speak with patient regarding skilled nursing.

## 2015-07-29 NOTE — Progress Notes (Signed)
   SUBJECTIVE: Pt denies CP or SOB. Troponin increased to 6, blood cultures positive   Filed Vitals:   07/28/15 1536 07/28/15 1954 07/29/15 0513 07/29/15 0858  BP:  133/54 143/85 133/89  Pulse: 76 66 62 67  Temp:  97.6 F (36.4 C) 97.6 F (36.4 C) 98.3 F (36.8 C)  TempSrc:  Oral Oral Oral  Resp:  18 18 18   Height:      Weight:   85.322 kg (188 lb 1.6 oz)   SpO2: 94% 91% 96% 98%    Intake/Output Summary (Last 24 hours) at 07/29/15 1115 Last data filed at 07/29/15 0425  Gross per 24 hour  Intake     50 ml  Output    325 ml  Net   -275 ml    LABS: Basic Metabolic Panel:  Recent Labs  07/27/15 2206 07/29/15 0253  NA 138 138  K 4.0 3.7  CL 102 106  CO2 27 24  GLUCOSE 221* 271*  BUN 17 22*  CREATININE 1.18 1.11  CALCIUM 9.0 8.8*   Liver Function Tests:  Recent Labs  07/27/15 2206  AST 117*  ALT 95*  ALKPHOS 155*  BILITOT 1.1  PROT 7.7  ALBUMIN 4.0   No results for input(s): LIPASE, AMYLASE in the last 72 hours. CBC:  Recent Labs  07/27/15 2206 07/29/15 0253  WBC 17.4* 17.6*  NEUTROABS 15.5*  --   HGB 14.1 13.9  HCT 41.7 41.0  MCV 92.1 91.8  PLT 270 252   Cardiac Enzymes:  Recent Labs  07/28/15 0444 07/28/15 1731 07/29/15 0253  TROPONINI 0.14* 5.21* 6.37*   BNP: Invalid input(s): POCBNP D-Dimer: No results for input(s): DDIMER in the last 72 hours. Hemoglobin A1C: No results for input(s): HGBA1C in the last 72 hours. Fasting Lipid Panel: No results for input(s): CHOL, HDL, LDLCALC, TRIG, CHOLHDL, LDLDIRECT in the last 72 hours. Thyroid Function Tests: No results for input(s): TSH, T4TOTAL, T3FREE, THYROIDAB in the last 72 hours.  Invalid input(s): FREET3 Anemia Panel: No results for input(s): VITAMINB12, FOLATE, FERRITIN, TIBC, IRON, RETICCTPCT in the last 72 hours.   PHYSICAL EXAM General: Well developed, well nourished, in no acute distress HEENT:  Normocephalic and atramatic Neck:  No JVD.  Lungs: Clear bilaterally to  auscultation and percussion. Heart: HRRR . Normal S1 and S2 without gallops or murmurs.  Abdomen: Bowel sounds are positive, abdomen soft and non-tender  Msk:  Back normal, normal gait. Normal strength and tone for age. Extremities: No clubbing, cyanosis or edema.   Neuro: Alert and oriented X 3. Psych:  Good affect, responds appropriately  TELEMETRY: Reviewed telemetry pt in NSR  ASSESSMENT AND PLAN:  1. NSTEMI: troponin increased to 6, no CP, positive blood cultures. Cannot do cardiac cath at this time 2/2 bacteremia. Had lengthy discussion with pts daughter [name] who will discuss with family if they want to proceed with cardiac cath when infection subsides. Continue lovenox, asa, statin and BP control. If family agrees, pt will need cardiac cath before d/c, possible Friday or Monday.    Patient and plan discussed with supervising provider, Dr. Neoma Laming, who agrees with above findings.   Kelby Fam Bullock, [place]  07/29/2015 11:15 AM

## 2015-07-29 NOTE — Progress Notes (Signed)
[place] INFECTIOUS DISEASE PROGRESS NOTE Date of Admission:  07/27/2015     ID: Robert Rojas is a 81 y.o. male with  GNR bacteremia Active Problems:   Syncope   Subjective: No fevers, denies abd pain, some cough.   ROS  Eleven systems are reviewed and negative except per hpi  Medications:  Antibiotics Given (last 72 hours)    Date/Time Action Medication Dose Rate   07/28/15 0223 Given   azithromycin (ZITHROMAX) tablet 500 mg 500 mg    07/28/15 1712 Given   ampicillin-sulbactam (UNASYN) 1.5 g in sodium chloride 0.9 % 50 mL IVPB 1.5 g 100 mL/hr   07/28/15 2234 Given   ampicillin-sulbactam (UNASYN) 1.5 g in sodium chloride 0.9 % 50 mL IVPB 1.5 g 100 mL/hr   07/29/15 0415 Given   ampicillin-sulbactam (UNASYN) 1.5 g in sodium chloride 0.9 % 50 mL IVPB 1.5 g 100 mL/hr   07/29/15 0900 Given   azithromycin (ZITHROMAX) tablet 500 mg 500 mg    07/29/15 0901 Given   ampicillin-sulbactam (UNASYN) 1.5 g in sodium chloride 0.9 % 50 mL IVPB 1.5 g 100 mL/hr     . ampicillin-sulbactam (UNASYN) IV  1.5 g Intravenous Q6H  . antiseptic oral rinse  7 mL Mouth Rinse BID  . aspirin EC  81 mg Oral Daily  . azithromycin  500 mg Oral Daily  . carvedilol  6.25 mg Oral BID WC  . digoxin  0.25 mg Oral Daily  . diltiazem  180 mg Oral Daily  . docusate sodium  100 mg Oral BID  . enoxaparin (LOVENOX) injection  1 mg/kg Subcutaneous Q12H  . fluticasone  1 spray Each Nare Daily  . furosemide  40 mg Oral Daily  . gemfibrozil  600 mg Oral BID AC  . insulin aspart  0-5 Units Subcutaneous QHS  . insulin aspart  0-9 Units Subcutaneous TID WC  . insulin glargine  25 Units Subcutaneous QHS  . potassium chloride SA  20 mEq Oral Daily  . predniSONE  50 mg Oral Q breakfast  . rosuvastatin  20 mg Oral q1800  . sertraline  50 mg Oral Daily  . sodium chloride  3 mL Intravenous Q12H    Objective: Vital signs in last 24 hours: Temp:  [97.6 F (36.4 C)-98.3 F (36.8 C)] 98.2 F (36.8 C) (11/16  1152) Pulse Rate:  [62-71] 71 (11/16 1152) Resp:  [18] 18 (11/16 1152) BP: (133-143)/(52-89) 135/52 mmHg (11/16 1152) SpO2:  [91 %-98 %] 94 % (11/16 1152) Weight:  [85.322 kg (188 lb 1.6 oz)] 85.322 kg (188 lb 1.6 oz) (11/16 0513) Constitutional:dishelved, diff to understand  Mouth/Throat: Oropharynx is clear and moist. No oropharyngeal exudate.  Cardiovascular: Normal rate, regular rhythm and normal heart sounds. Exam reveals no gallop and no friction rub.  No murmur heard.  Pulmonary/Chest: Effort normal and breath sounds normal. No respiratory distress. He has no wheezes.  Abdominal: Soft. Bowel sounds are normal. He exhibits no distension. There is no tenderness. ,mild ttp lower qadrants No cervical adenopathy.  Neurological: He is awake but diff to understand speech Skin: Skin is warm and dry. No rash noted. No erythema.  Psychiatric: dishelved  Lab Results  Recent Labs  07/27/15 2206 07/29/15 0253  WBC 17.4* 17.6*  HGB 14.1 13.9  HCT 41.7 41.0  NA 138 138  K 4.0 3.7  CL 102 106  CO2 27 24  BUN 17 22*  CREATININE 1.18 1.11    Microbiology: Results for orders placed or performed  during the hospital encounter of 07/27/15  Culture, blood (routine x 2)     Status: None (Preliminary result)   Collection Time: 07/28/15  2:51 AM  Result Value Ref Range Status   Specimen Description BLOOD  Final   Special Requests NONE  Final   Culture  Setup Time   Final    GRAM NEGATIVE RODS IN BOTH AEROBIC AND ANAEROBIC BOTTLES CRITICAL RESULT CALLED TO, READ BACK BY AND VERIFIED WITH: RN [facility] LEE 07/28/15 1546    Culture   Final    GRAM NEGATIVE RODS IDENTIFICATION AND SUSCEPTIBILITIES TO FOLLOW    Report Status PENDING  Incomplete  Culture, blood (routine x 2)     Status: None (Preliminary result)   Collection Time: 07/28/15  2:52 AM  Result Value Ref Range Status   Specimen Description BLOOD  Final   Special Requests NONE  Final   Culture  Setup Time   Final    GRAM  NEGATIVE RODS CRITICAL RESULT CALLED TO, READ BACK BY AND VERIFIED WITH: RN [facility] LEE 07/28/15 1346 IN BOTH AEROBIC AND ANAEROBIC BOTTLES    Culture   Final    GRAM NEGATIVE RODS IDENTIFICATION AND SUSCEPTIBILITIES TO FOLLOW    Report Status PENDING  Incomplete  C difficile quick scan w PCR reflex     Status: None   Collection Time: 07/29/15 10:45 AM  Result Value Ref Range Status   C Diff antigen NEGATIVE NEGATIVE Final   C Diff toxin NEGATIVE NEGATIVE Final   C Diff interpretation Negative for C. difficile  Final    Studies/Results: Dg Chest 2 View  07/27/2015  CLINICAL DATA:  Status post syncope.  Weakness.  Initial encounter. EXAM: CHEST  2 VIEW COMPARISON:  Chest radiograph performed 06/24/2013 FINDINGS: The lungs are well-aerated. Mild peribronchial thickening is noted. There is no evidence of focal opacification, pleural effusion or pneumothorax. The heart is normal in size; the mediastinal contour is within normal limits. No acute osseous abnormalities are seen. IMPRESSION: Mild peribronchial thickening noted.  Lungs otherwise clear. Electronically Signed   By: Garald Balding M.D.   On: 07/27/2015 23:09   Ct Head Wo Contrast  07/27/2015  CLINICAL DATA:  Syncope.  Personal history of stroke. EXAM: CT HEAD WITHOUT CONTRAST TECHNIQUE: Contiguous axial images were obtained from the base of the skull through the vertex without intravenous contrast. COMPARISON:  Head CT 06/24/2013 FINDINGS: The brain shows generalized atrophy. There are extensive chronic ischemic changes affecting the brainstem. No focal cerebellar insult. The cerebral hemispheres show old infarctions in the basal ganglia regions and thalami, right worse than left. No cortical or large vessel territory infarction. No sign of acute infarction, mass lesion, hemorrhage, hydrocephalus or extra-axial collection. The calvarium is unremarkable. Sinuses, and middle ears are clear. Some fluid in the mastoid air cells on the right.  There is atherosclerotic calcification of the major vessels at the base of the brain. IMPRESSION: No acute finding by CT. Atrophy, extensive chronic small-vessel ischemic changes affecting the brainstem, thalami and basal ganglia. Electronically Signed   By: Nelson Chimes M.D.   On: 07/27/2015 23:35    Assessment/Plan: 81 yo with GNR sepsis of unclear etiology.  WBC 17.4 on admit, lfts with mild increase ast and alt and alk phos.  C diff negative. Does have some bronchial thickening on CXR.   Rec Cont unasyn pending ID of organism He has no abd pain at this time. Did not have ua or ucx done at admit but when  I saw him yesterday he had some pelvic tenderness on exam so I suspect UTI as source- will check UA and UCX today If worsens would check CT abd pelvis - for now check cbc and lfts in am Thank you very much for the consult. Will follow with you.  [place], [place]   07/29/2015, 3:39 PM

## 2015-07-30 ENCOUNTER — Inpatient Hospital Stay: Payer: Medicare Other

## 2015-07-30 LAB — HEPATIC FUNCTION PANEL
ALT: 49 U/L (ref 17–63)
AST: 43 U/L — AB (ref 15–41)
Albumin: 3.2 g/dL — ABNORMAL LOW (ref 3.5–5.0)
Alkaline Phosphatase: 98 U/L (ref 38–126)
BILIRUBIN DIRECT: 0.2 mg/dL (ref 0.1–0.5)
BILIRUBIN INDIRECT: 0.8 mg/dL (ref 0.3–0.9)
BILIRUBIN TOTAL: 1 mg/dL (ref 0.3–1.2)
Total Protein: 6.7 g/dL (ref 6.5–8.1)

## 2015-07-30 LAB — CULTURE, BLOOD (ROUTINE X 2)

## 2015-07-30 LAB — CBC
HEMATOCRIT: 41.1 % (ref 40.0–52.0)
Hemoglobin: 13.6 g/dL (ref 13.0–18.0)
MCH: 30.4 pg (ref 26.0–34.0)
MCHC: 33.1 g/dL (ref 32.0–36.0)
MCV: 92 fL (ref 80.0–100.0)
PLATELETS: 253 10*3/uL (ref 150–440)
RBC: 4.46 MIL/uL (ref 4.40–5.90)
RDW: 15.3 % — AB (ref 11.5–14.5)
WBC: 15.2 10*3/uL — ABNORMAL HIGH (ref 3.8–10.6)

## 2015-07-30 LAB — BASIC METABOLIC PANEL
ANION GAP: 9 (ref 5–15)
BUN: 23 mg/dL — AB (ref 6–20)
CALCIUM: 8.5 mg/dL — AB (ref 8.9–10.3)
CO2: 23 mmol/L (ref 22–32)
Chloride: 107 mmol/L (ref 101–111)
Creatinine, Ser: 0.91 mg/dL (ref 0.61–1.24)
GFR calc Af Amer: >60 mL/min (ref >60)
GLUCOSE: 203 mg/dL — AB (ref 65–99)
Potassium: 3.4 mmol/L — ABNORMAL LOW (ref 3.5–5.1)
Sodium: 139 mmol/L (ref 135–145)

## 2015-07-30 LAB — GLUCOSE, CAPILLARY
GLUCOSE-CAPILLARY: 185 mg/dL — AB (ref 65–99)
GLUCOSE-CAPILLARY: 154 mg/dL — AB (ref 65–99)
Glucose-Capillary: 183 mg/dL — ABNORMAL HIGH (ref 65–99)

## 2015-07-30 LAB — DIGOXIN LEVEL: Digoxin Level: 0.9 ng/mL (ref 0.8–2.0)

## 2015-07-30 MED ORDER — ENOXAPARIN SODIUM 100 MG/ML ~~LOC~~ SOLN
1.0000 mg/kg | Freq: Two times a day (BID) | SUBCUTANEOUS | Status: DC
  Administered 2015-07-30 – 2015-07-31 (×2): 85 mg via SUBCUTANEOUS
  Filled 2015-07-30 (×2): qty 1

## 2015-07-30 MED ORDER — PNEUMOCOCCAL VAC POLYVALENT 25 MCG/0.5ML IJ INJ
0.5000 mL | INJECTION | INTRAMUSCULAR | Status: AC
  Administered 2015-07-31: 0.5 mL via INTRAMUSCULAR
  Filled 2015-07-30: qty 0.5

## 2015-07-30 MED ORDER — IOHEXOL 300 MG/ML  SOLN
100.0000 mL | Freq: Once | INTRAMUSCULAR | Status: AC | PRN
  Administered 2015-07-30: 100 mL via INTRAVENOUS

## 2015-07-30 MED ORDER — POTASSIUM CHLORIDE CRYS ER 20 MEQ PO TBCR
40.0000 meq | EXTENDED_RELEASE_TABLET | Freq: Once | ORAL | Status: AC
  Administered 2015-07-30: 40 meq via ORAL
  Filled 2015-07-30: qty 2

## 2015-07-30 MED ORDER — IOHEXOL 240 MG/ML SOLN
25.0000 mL | INTRAMUSCULAR | Status: AC
  Administered 2015-07-30 (×2): 25 mL via INTRAVENOUS

## 2015-07-30 NOTE — Progress Notes (Signed)
ID E note Pt grew E coli pan sensitive.  WBC remains at 15    Rec -  Since wbc up still and no obvious source of infection with E coli would check CT abd pelvis to look for intraabdominal infection. If not source found then can narrow abx and DC - If CT neg then, when stable for dc would place on keflex 500 tid for a total of 14 days from start of abx- stop date will be 11/28 Should fu with PCP to ensure stability after stopping abx. No need to see ID in fu unless infection recurs or new or concerning ID issues

## 2015-07-30 NOTE — Care Management Important Message (Signed)
Important Message  Patient Details  Name: Kedron Nor MRN: [id number] Date of Birth: 06-Feb-1930   Medicare Important Message Given:  Yes    Katrina Stack, RN 07/30/2015, 6:07 PM

## 2015-07-30 NOTE — NC FL2 (Signed)
[place] LEVEL OF CARE SCREENING TOOL     IDENTIFICATION  Patient Name: Robert Rojas Birthdate: 10/14/1929 Sex: male Admission Date (Current Location): 07/27/2015  [place] and [place] Number:  Set designer)   Facility and Address:  [facility], [address] [place], [place] [address]      Provider Number: [id number]  Attending Physician Name and Address:  Max Sane, MD  Relative Name and Phone Number:       Current Level of Care: Hospital Recommended Level of Care: [place] Prior Approval Number:    Date Approved/Denied:   PASRR Number:  [id number])  Discharge Plan: SNF    Current Diagnoses: Patient Active Problem List   Diagnosis Date Noted  . Syncope 07/28/2015  . Erectile dysfunction of organic origin 05/05/2015    Orientation ACTIVITIES/SOCIAL BLADDER RESPIRATION    Self, Place  Active Incontinent Normal  BEHAVIORAL SYMPTOMS/MOOD NEUROLOGICAL BOWEL NUTRITION STATUS      Incontinent Diet (heart healthy)  PHYSICIAN VISITS COMMUNICATION OF NEEDS Height & Weight Skin    Verbally _0  (185.4 cm) 190 lbs. Normal          AMBULATORY STATUS RESPIRATION      Normal      Personal Care Assistance Level of Assistance               Functional Limitations Info  Sight, Hearing, Speech Sight Info: Adequate Hearing Info: Adequate Speech Info: Impaired       SPECIAL CARE FACTORS FREQUENCY  PT (By licensed PT)                   Additional Factors Info  Code Status, Allergies Code Status Info:  (DNR) Allergies Info:  (Lipitor, Lisinopril)           Current Medications (07/30/2015): Current Facility-Administered Medications  Medication Dose Route Frequency Provider Last Rate Last Dose  . acetaminophen (TYLENOL) tablet 650 mg  650 mg Oral Q6H PRN Hillary Bow, MD       Or  . acetaminophen (TYLENOL) suppository 650 mg  650 mg Rectal Q6H PRN Srikar Sudini, MD      . albuterol  (PROVENTIL) (2.5 MG/3ML) 0.083% nebulizer solution 2.5 mg  2.5 mg Nebulization Q2H PRN Srikar Sudini, MD      . ampicillin-sulbactam (UNASYN) 1.5 g in sodium chloride 0.9 % 50 mL IVPB  1.5 g Intravenous Q6H Adrian Prows, MD   1.5 g at 07/30/15 0956  . antiseptic oral rinse (CPC / CETYLPYRIDINIUM CHLORIDE 0.05%) solution 7 mL  7 mL Mouth Rinse BID Hillary Bow, MD   7 mL at 07/30/15 0958  . aspirin EC tablet 81 mg  81 mg Oral Daily Hillary Bow, MD   81 mg at 07/30/15 0957  . carvedilol (COREG) tablet 6.25 mg  6.25 mg Oral BID WC Hillary Bow, MD   6.25 mg at 07/30/15 0844  . digoxin (LANOXIN) tablet 0.25 mg  0.25 mg Oral Daily Srikar Sudini, MD   0.25 mg at 07/30/15 0957  . diltiazem (CARDIZEM CD) 24 hr capsule 180 mg  180 mg Oral Daily Hillary Bow, MD   180 mg at 07/30/15 0957  . docusate sodium (COLACE) capsule 100 mg  100 mg Oral BID Hillary Bow, MD   100 mg at 07/30/15 0957  . enoxaparin (LOVENOX) injection 90 mg  1 mg/kg Subcutaneous Q12H Ramond Dial, RPH   90 mg at 07/30/15 0848  . fluticasone (FLONASE) 50 MCG/ACT nasal spray 1 spray  1 spray Each Nare Daily Hillary Bow, MD   1 spray at 07/30/15 1009  . furosemide (LASIX) tablet 40 mg  40 mg Oral Daily Hillary Bow, MD   40 mg at 07/30/15 0957  . gemfibrozil (LOPID) tablet 600 mg  600 mg Oral BID AC Srikar Sudini, MD   600 mg at 07/30/15 0845  . HYDROcodone-acetaminophen (NORCO/VICODIN) 5-325 MG per tablet 1 tablet  1 tablet Oral Q4H PRN Srikar Sudini, MD      . insulin aspart (novoLOG) injection 0-5 Units  0-5 Units Subcutaneous QHS Hillary Bow, MD   4 Units at 07/29/15 2228  . insulin aspart (novoLOG) injection 0-9 Units  0-9 Units Subcutaneous TID WC Hillary Bow, MD   2 Units at 07/30/15 1252  . insulin glargine (LANTUS) injection 25 Units  25 Units Subcutaneous QHS Max Sane, MD   25 Units at 07/29/15 2229  . ondansetron (ZOFRAN) tablet 4 mg  4 mg Oral Q6H PRN Hillary Bow, MD       Or  . ondansetron (ZOFRAN)  injection 4 mg  4 mg Intravenous Q6H PRN Srikar Sudini, MD      . Derrill Memo ON 07/31/2015] pneumococcal 23 valent vaccine (PNU-IMMUNE) injection 0.5 mL  0.5 mL Intramuscular Tomorrow-1000 Vipul Shah, MD      . polyethylene glycol (MIRALAX / GLYCOLAX) packet 17 g  17 g Oral Daily PRN Srikar Sudini, MD      . potassium chloride SA (K-DUR,KLOR-CON) CR tablet 20 mEq  20 mEq Oral Daily Hillary Bow, MD   20 mEq at 07/30/15 0957  . rosuvastatin (CRESTOR) tablet 20 mg  20 mg Oral q1800 Max Sane, MD   20 mg at 07/29/15 1701  . sertraline (ZOLOFT) tablet 50 mg  50 mg Oral Daily Hillary Bow, MD   50 mg at 07/30/15 0957  . sodium chloride 0.9 % injection 3 mL  3 mL Intravenous Q12H Hillary Bow, MD   3 mL at 07/30/15 8527   Do not use this list as official medication orders. Please verify with discharge summary.  Discharge Medications:   Medication List    ASK your doctor about these medications        aspirin 81 MG tablet  Take 81 mg by mouth daily.     carvedilol 6.25 MG tablet  Commonly known as:  COREG  Take 6.25 mg by mouth 2 (two) times daily with a meal.     cetirizine 10 MG tablet  Commonly known as:  ZYRTEC  TAKE 1 TABLET BY MOUTH AT BEDTIME FOR NASAL DRIP     digoxin 0.25 MG tablet  Commonly known as:  LANOXIN  Take 0.25 mg by mouth daily.     diltiazem 240 MG 24 hr capsule  Commonly known as:  CARDIZEM CD  Take 120 mg by mouth daily.     furosemide 40 MG tablet  Commonly known as:  LASIX  Take 40 mg by mouth daily.     KLOR-CON M20 20 MEQ tablet  Generic drug:  potassium chloride SA  Take 20 mEq by mouth daily.     LANTUS SOLOSTAR 100 UNIT/ML Solostar Pen  Generic drug:  Insulin Glargine  Inject 40 Units into the skin daily at 10 pm.     lisinopril 2.5 MG tablet  Commonly known as:  PRINIVIL,ZESTRIL  Take 2.5 mg by mouth daily.     ONE TOUCH ULTRA MINI W/DEVICE Kit     ONE TOUCH ULTRA TEST test strip  Generic drug:  glucose blood     ONETOUCH DELICA LANCETS  42J Misc     PRADAXA 150 MG Caps capsule  Generic drug:  dabigatran  Take 150 mg by mouth 2 (two) times daily.        Relevant Imaging Results:  Relevant Lab Results:  Recent Labs    Additional Information  (SS: [id number])  Maurine Cane, LCSW

## 2015-07-30 NOTE — Progress Notes (Signed)
PT Cancellation Note  Patient Details Name: Robert Rojas MRN: [id number] DOB: 1930-06-10   Cancelled Treatment:    Reason Eval/Treat Not Completed: Medical issues which prohibited therapy;Patient at procedure or test/unavailable (prepped and heading out shortly to [place]).  Check later as time allows.   Ramond Dial 07/30/2015, 12:31 PM   Mee Hives, PT MS Acute Rehab Dept. Number: [facility] [id number] and [place] [phone]

## 2015-07-30 NOTE — Clinical Social Work Note (Signed)
Clinical Social Work Assessment  Patient Details  Name: Robert Rojas MRN: [id number] Date of Birth: 03/18/1930  Date of referral:  07/30/15               Reason for consult:  Facility Placement                Permission sought to share information with:  Facility Sport and exercise psychologist, Family Supports Permission granted to share information::  Yes, Verbal Permission Granted  Name::      (Daughter [name]  [phone])  Agency::     Relationship::     Contact Information:     Housing/Transportation Living arrangements for the past 2 months:  Single Family Home Source of Information:  Patient, Adult Children, Medical Team Patient Interpreter Needed:  None Criminal Activity/Legal Involvement Pertinent to Current Situation/Hospitalization:  No - Comment as needed Significant Relationships:  Adult Children, Spouse Lives with:  Spouse Do you feel safe going back to the place where you live?  Yes Need for family participation in patient care:  Yes (Comment)  Care giving concerns:  None at this time   Facilities manager / plan:  Holiday representative (CSW) consult for SNF placement, PT is recommending STR to assist with getting patient back to previous level of functioning.   Patient was alert and oriented he attempted to engaged in conversation with CSW however difficult to understand. Patient is in agreement with SNF and says his daughter will decide which facility (Information provided by his daughter [name] ).  CSW introduced self and explained role of CSW department.  Patient currently lives with his wife who has mild demential . CSW explained that PT is recommending rehab.  Patient has been to SNF and daughter does not want him to go to [facility]. CSW reviewed SNF process with patient, Patient is agreeable to SNF search and prefers [facility].     CSW will complete FL2, and fax out for available bed in anticipation of discharging to SNF for rehab.   Employment status:   Disabled (Comment on whether or not currently receiving Disability) Insurance information:  Managed Medicare PT Recommendations:  [place] / Referral to community resources:  [place]  Patient/Family's Response to care:  Patient and daughter were appreciative of information provided by CSW.   Patient/Family's Understanding of and Emotional Response to Diagnosis, Current Treatment, and Prognosis:  Patient's daughter  understands that patient is under continued medical work up at this time.  Once medically stable he will discharge to a skilled nursing facility.  Emotional Assessment Appearance:  Appears stated age Attitude/Demeanor/Rapport:    Affect (typically observed):  Accepting, Pleasant, Calm Orientation:  Oriented to Self, Oriented to Place Alcohol / Substance use:    Psych involvement (Current and /or in the community):  No (Comment)  Discharge Needs  Concerns to be addressed:  Discharge Planning Concerns, Care Coordination Readmission within the last 30 days:  No Current discharge risk:  Chronically ill, Dependent with Mobility Barriers to Discharge:  Continued Medical Work up, No Barriers Identified   Maurine Cane, LCSW 07/30/2015, 12:57 PM

## 2015-07-30 NOTE — Consult Note (Signed)
ANTICOAGULATION CONSULT NOTE - Follow up  Pharmacy Consult for lovenox Indication: chest pain/ACS  Allergies  Allergen Reactions  . Lipitor [Atorvastatin]   . Lisinopril     Patient Measurements: Height: 6\' 1"  (185.4 cm) Weight: 190 lb 6.4 oz (86.365 kg) IBW/kg (Calculated) : 79.9 Heparin Dosing Weight:   Vital Signs: Temp: 97.8 F (36.6 C) (11/17 1157) Temp Source: Oral (11/17 1157) BP: 138/91 mmHg (11/17 1157) Pulse Rate: 81 (11/17 1157)  Labs:  Recent Labs  07/27/15 2206 07/28/15 0444 07/28/15 1731 07/29/15 0253 07/30/15 0508  HGB 14.1  --   --  13.9 13.6  HCT 41.7  --   --  41.0 41.1  PLT 270  --   --  252 253  CREATININE 1.18  --   --  1.11 0.91  TROPONINI 0.07* 0.14* 5.21* 6.37*  --     Estimated Creatinine Clearance: 69.5 mL/min (by C-G formula based on Cr of 0.91).   Medical History: Past Medical History  Diagnosis Date  . Coronary artery disease   . Essential hypertension   . Hyperlipemia   . Type 2 diabetes mellitus ([place])   . GERD (gastroesophageal reflux disease)   . TIA (transient ischemic attack)   . BCC (basal cell carcinoma)   . Spinal stenosis   . MI (myocardial infarction) ([place]) 01/2011  . CVA (cerebral infarction)     left sided  . Arthralgia     left knee/patella/tibia/fibula  . Prostatitis   . Erectile dysfunction     Medications:  Scheduled:  . ampicillin-sulbactam (UNASYN) IV  1.5 g Intravenous Q6H  . antiseptic oral rinse  7 mL Mouth Rinse BID  . aspirin EC  81 mg Oral Daily  . carvedilol  6.25 mg Oral BID WC  . digoxin  0.25 mg Oral Daily  . diltiazem  180 mg Oral Daily  . docusate sodium  100 mg Oral BID  . enoxaparin (LOVENOX) injection  1 mg/kg Subcutaneous Q12H  . fluticasone  1 spray Each Nare Daily  . furosemide  40 mg Oral Daily  . gemfibrozil  600 mg Oral BID AC  . insulin aspart  0-5 Units Subcutaneous QHS  . insulin aspart  0-9 Units Subcutaneous TID WC  . insulin glargine  25 Units Subcutaneous QHS  .  [START ON 07/31/2015] pneumococcal 23 valent vaccine  0.5 mL Intramuscular Tomorrow-1000  . potassium chloride SA  20 mEq Oral Daily  . rosuvastatin  20 mg Oral q1800  . sertraline  50 mg Oral Daily  . sodium chloride  3 mL Intravenous Q12H    Assessment: Pt is a 81 year old male with a NSTEMI. Pt on Pradaxa PTA. Pharmacy is consulted to dose lovenox for NSTEMI  Current orders for enoxaparin 90 mg Sq q12h CrCl 69.5 ml/min  Goal of Therapy:  Anti-Xa level 0.6-1 units/ml 4hrs after LMWH dose given Monitor platelets by anticoagulation protocol: Yes   Plan:  Lovenox 1mg /kg q 12 hours - will order enoxaparin 85 mg Sq q12h (change in wt).  Hgb/Plt has been stable (Hgb 13.6, Plt 253)  Pharmacy to continue to monitor renal function/cbc  Rayna Sexton L 07/30/2015,3:25 PM

## 2015-07-30 NOTE — Progress Notes (Signed)
Inpatient Diabetes Program Recommendations  AACE/ADA: New Consensus Statement on Inpatient Glycemic Control (2015)  Target Ranges:  Prepandial:   less than 140 mg/dL      [facility] postprandial:   less than 180 mg/dL (1-2 hours)      Critically ill patients:  140 - 180 mg/dL   Review of Glycemic Control  Results for Robert Rojas, Robert Rojas (MRN [id number]) as of 07/30/2015 09:46  Ref. Range 07/28/2015 20:59 07/29/2015 07:44 07/29/2015 12:13 07/29/2015 15:45 07/30/2015 07:48  Glucose-Capillary Latest Ref Range: 65-99 mg/dL 324 (H) 219 (H) 305 (H) 291 (H) 185 (H)   Diabetes history: DM2, A1C 7.0% Outpatient Diabetes medications: Lantus 40 units Q10PM  Current orders for Inpatient glycemic control: Lantus 25 units QHS, Novolog 0-9 units TID with meals, Novolog 0-5 units HS  Inpatient Diabetes Program Recommendations;   Fasting blood sugars improved with increase in Lantus insulin.  Please consider ordering Novolog 4 units TID with meals for meal coverage.  Gentry Fitz, RN, BA, MHA, CDE Diabetes Coordinator Inpatient Diabetes Program  [phone] (Team Pager) [phone] ([place]) 07/30/2015 9:49 AM

## 2015-07-30 NOTE — Progress Notes (Signed)
[place] [facility] [place] NAME: Bolden Mest    MR#:  [id number]  DATE OF BIRTH:  10-Dec-1929  SUBJECTIVE:  CHIEF COMPLAINT:   Chief Complaint  Patient presents with  . Loss of Consciousness  feels fine. No new changes. REVIEW OF SYSTEMS:  Review of Systems  Constitutional: Negative for fever, weight loss, malaise/fatigue and diaphoresis.  HENT: Negative for ear discharge, ear pain, hearing loss, nosebleeds, sore throat and tinnitus.   Eyes: Negative for blurred vision and pain.  Respiratory: Negative for cough, hemoptysis, shortness of breath and wheezing.   Cardiovascular: Negative for chest pain, palpitations, orthopnea and leg swelling.  Gastrointestinal: Negative for heartburn, nausea, vomiting, abdominal pain, diarrhea, constipation and blood in stool.  Genitourinary: Negative for dysuria, urgency and frequency.  Musculoskeletal: Negative for myalgias and back pain.  Skin: Negative for itching and rash.  Neurological: Negative for dizziness, tingling, tremors, focal weakness, seizures, weakness and headaches.  Psychiatric/Behavioral: Negative for depression. The patient is not nervous/anxious.    DRUG ALLERGIES:   Allergies  Allergen Reactions  . Lipitor [Atorvastatin]   . Lisinopril    VITALS:  Blood pressure 138/91, pulse 81, temperature 97.8 F (36.6 C), temperature source Oral, resp. rate 18, height 6\' 1"  (1.854 m), weight 86.365 kg (190 lb 6.4 oz), SpO2 95 %. PHYSICAL EXAMINATION:  Physical Exam  Constitutional: He is oriented to person, place, and time and well-developed, well-nourished, and in no distress.  HENT:  Head: Normocephalic and atraumatic.  Eyes: Conjunctivae and EOM are normal. Pupils are equal, round, and reactive to light.  Neck: Normal range of motion. Neck supple. No tracheal deviation present. No thyromegaly present.  Cardiovascular: Normal rate, regular rhythm and normal heart sounds.    Pulmonary/Chest: Effort normal and breath sounds normal. No respiratory distress. He has no wheezes. He exhibits no tenderness.  Abdominal: Soft. Bowel sounds are normal. He exhibits no distension. There is no tenderness.  Musculoskeletal: Normal range of motion.  Neurological: He is alert and oriented to person, place, and time. No cranial nerve deficit.  Skin: Skin is warm and dry. No rash noted.  Psychiatric: Mood and affect normal.   LABORATORY PANEL:   CBC  Recent Labs Lab 07/30/15 0508  WBC 15.2*  HGB 13.6  HCT 41.1  PLT 253   ------------------------------------------------------------------------------------------------------------------ Chemistries   Recent Labs Lab 07/30/15 0508  NA 139  K 3.4*  CL 107  CO2 23  GLUCOSE 203*  BUN 23*  CREATININE 0.91  CALCIUM 8.5*  AST 43*  ALT 49  ALKPHOS 98  BILITOT 1.0   RADIOLOGY:  Ct Abdomen Pelvis W Contrast  07/30/2015  CLINICAL DATA:  Elevated white blood cell count. No source for infection. EXAM: CT ABDOMEN AND PELVIS WITH CONTRAST TECHNIQUE: Multidetector CT imaging of the abdomen and pelvis was performed using the standard protocol following bolus administration of intravenous contrast. CONTRAST:  171mL OMNIPAQUE IOHEXOL 300 MG/ML  SOLN COMPARISON:  None. FINDINGS: Lower chest: Small pleural effusions identified. There is overlying dependent changes. Hepatobiliary: Hepatic steatosis noted. No focal liver abnormality. Stone within the gallbladder measures 1.6 cm. No gallbladder wall thickening or pericholecystic fluid. Filling defect within the distal common bile duct is suspected, image 60/series 4 Pancreas: Normal appearance of the pancreas. Spleen: Multiple calcified granulomas noted. Adrenals/Urinary Tract: The adrenal glands are normal. Unremarkable appearance of the right kidney. The left kidney is also normal. The urinary bladder appears normal. Stomach/Bowel: The stomach is within normal limits. The  small bowel  loops have a normal course and caliber. No obstruction. Normal appearance of the colon. The appendix is visualized and appears normal. Normal appearance of the proximal colon. Numerous distal colonic diverticula identified without acute inflammation. Vascular/Lymphatic: Calcified atherosclerotic disease involves the abdominal aorta. No aneurysm. No enlarged retroperitoneal or mesenteric adenopathy. No enlarged pelvic or inguinal lymph nodes. Reproductive: Prostate gland appears enlarged. Other: There is no ascites or focal fluid collections within the abdomen or pelvis. Musculoskeletal: No aggressive lytic or sclerotic bone lesions. The patient is status post posterior fusion of the L4-5 spinous process ease. There is degenerative disc disease at L5-S1. IMPRESSION: 1. No abscess identified within the abdomen or pelvis. 2. Gallstones. Suspect filling defect within the common bile duct which may reflect choledocholithiasis. If further evaluation is clinically indicated an MRCP may be helpful. 3. Hepatic steatosis. 4. Aortic atherosclerosis. Electronically Signed   By: Kerby Moors M.D.   On: 07/30/2015 14:21   ASSESSMENT AND PLAN:   * Non-ST elevation MI: d/w daughter who prefers conservative mgmt and no cath. Continue Coreg, Cardizem, pravastatin.   * E. coli bacteremia: on Unasyn. ID following, likely abd source. CT abd not showing any obvious pathology than some gall stones and possible choledocholithiasis. C.diff neg. ID recommends 14 days of Keflex.  * Pre-syncope/Syncope With history of atrial fibrillation and frequent PVCs. Echocardiogram showing Severe LV systolic dysfunction and wall motion abnormalities due to CAD. EF 35%  * Acute bronchitis Continue Azithromycin, and albuterol as needed  * Hypertension, uncontrolled Order home medications first. IV when necessary medications as needed.  * Diabetes mellitus Sliding scale insulin and diabetic diet  * History of CVA Has baseline slurred  speech and is stable  * DVT prophylaxis Patient is on pradaxa which is on hold but i guess we can resume it as IF no cath planned for now.  PT recommends STR/SNF - family chose [facility] where he will be D/C with palliative care following  All the records are reviewed and case discussed with Care Management/Social Worker. Management plans discussed with the patient, family (daughter [name] on [phone])  and they are in agreement.  CODE STATUS: DO NOT RESUSCITATE  TOTAL TIME TAKING CARE OF THIS PATIENT: 35 minutes.   More than 50% of the time was spent in counseling/coordination of care: YES (daughter [name] on [phone])  POSSIBLE D/C IN AM, DEPENDING ON CLINICAL CONDITION and infection w/up   Max Sane M.D on 07/30/2015 at 4:47 PM  Between 7am to 6pm - Pager - [phone]  After 6pm go to [email or web address] - password [email or web address] [facility]  Office  [phone]  CC: Primary care physician; Perrin Maltese, MD

## 2015-07-30 NOTE — Care Management (Signed)
Anticipate that patient will discharge to [facility] 11/18 WITH palliative care to follow while he is at the facility

## 2015-07-30 NOTE — Progress Notes (Signed)
   SUBJECTIVE: Pt continues to deny CP or SOB, c/o hiccups this morning.    Filed Vitals:   07/29/15 1152 07/29/15 1951 07/30/15 0339 07/30/15 0751  BP: 135/52 132/64 158/74 153/68  Pulse: 71 68 73 75  Temp: 98.2 F (36.8 C) 98.1 F (36.7 C) 98.1 F (36.7 C) 98.3 F (36.8 C)  TempSrc:  Oral Oral Oral  Resp: 18 18 18    Height:      Weight:   86.365 kg (190 lb 6.4 oz)   SpO2: 94% 92% 92% 94%    Intake/Output Summary (Last 24 hours) at 07/30/15 0925 Last data filed at 07/30/15 [id number]  Gross per 24 hour  Intake    300 ml  Output    750 ml  Net   -450 ml    LABS: Basic Metabolic Panel:  Recent Labs  07/27/15 2206 07/29/15 0253  NA 138 138  K 4.0 3.7  CL 102 106  CO2 27 24  GLUCOSE 221* 271*  BUN 17 22*  CREATININE 1.18 1.11  CALCIUM 9.0 8.8*   Liver Function Tests:  Recent Labs  07/27/15 2206  AST 117*  ALT 95*  ALKPHOS 155*  BILITOT 1.1  PROT 7.7  ALBUMIN 4.0   No results for input(s): LIPASE, AMYLASE in the last 72 hours. CBC:  Recent Labs  07/27/15 2206 07/29/15 0253 07/30/15 0508  WBC 17.4* 17.6* 15.2*  NEUTROABS 15.5*  --   --   HGB 14.1 13.9 13.6  HCT 41.7 41.0 41.1  MCV 92.1 91.8 92.0  PLT 270 252 253   Cardiac Enzymes:  Recent Labs  07/28/15 0444 07/28/15 1731 07/29/15 0253  TROPONINI 0.14* 5.21* 6.37*   BNP: Invalid input(s): POCBNP D-Dimer: No results for input(s): DDIMER in the last 72 hours. Hemoglobin A1C:  Recent Labs  07/28/15 1731  HGBA1C 7.0*   Fasting Lipid Panel: No results for input(s): CHOL, HDL, LDLCALC, TRIG, CHOLHDL, LDLDIRECT in the last 72 hours. Thyroid Function Tests: No results for input(s): TSH, T4TOTAL, T3FREE, THYROIDAB in the last 72 hours.  Invalid input(s): FREET3 Anemia Panel: No results for input(s): VITAMINB12, FOLATE, FERRITIN, TIBC, IRON, RETICCTPCT in the last 72 hours.   PHYSICAL EXAM General: Well developed, well nourished, in no acute distress HEENT:  Normocephalic and  atramatic Neck:  No JVD.  Lungs: Clear bilaterally to auscultation and percussion. Heart: HRRR . Normal S1 and S2 without gallops or murmurs.  Abdomen: Bowel sounds are positive, abdomen soft and non-tender  Msk:  Back normal, normal gait. Normal strength and tone for age. Extremities: No clubbing, cyanosis or edema.   Neuro: Alert and oriented X 3. Slurred but comprehendible speech Psych:  Good affect, responds appropriately  TELEMETRY: Reviewed telemetry pt in NSR with PVCs in bigeminy  ASSESSMENT AND PLAN:  1. NSTEMI: per hospitalist note, family prefers conservative management and refusing cardiac cath. Continue lovenox, asa, statin, and diltiazem. Pt is CP free.  2. 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 Continue Bb and digoxin 3. Hx of DVT: agree with restarting pradaxa if no cardiac cath is planned.   Patient and plan discussed with supervising provider, Dr. Neoma Laming, who agrees with above findings.   Kelby Fam Timblin, Myrtle  07/30/2015 9:25 AM

## 2015-07-30 NOTE — Consult Note (Signed)
Palliative Care consult completed. Family is in agreement with patient discharging to SNF for rehab at this time. Bed search has begun and Neoma Laming, CSW is working with family on SNF selection process. Spoke with daughter [name] who states she has called Neoma Laming, CSW back and left a message stating family is Art gallery manager for rehab. Explained Palliative Care services in the SNF through [facility] and is in agreement with this referral. Information faxed to Hospice, notifying them of plan upon discharge. Discussed this plan with Dr. Manuella Ghazi who is in agreement.   Atha Starks, MSW, LCSW Palliative Care social worker [phone] (c)

## 2015-07-31 DIAGNOSIS — I214 Non-ST elevation (NSTEMI) myocardial infarction: Secondary | ICD-10-CM | POA: Diagnosis not present

## 2015-07-31 LAB — URINE CULTURE: CULTURE: NO GROWTH

## 2015-07-31 LAB — GLUCOSE, CAPILLARY
GLUCOSE-CAPILLARY: 183 mg/dL — AB (ref 65–99)
GLUCOSE-CAPILLARY: 113 mg/dL — AB (ref 65–99)
GLUCOSE-CAPILLARY: 171 mg/dL — AB (ref 65–99)

## 2015-07-31 LAB — POTASSIUM: POTASSIUM: 3.4 mmol/L — AB (ref 3.5–5.1)

## 2015-07-31 MED ORDER — CEPHALEXIN 500 MG PO CAPS: 500.0000 mg | ORAL_CAPSULE | Freq: Three times a day (TID) | ORAL | Status: DC

## 2015-07-31 NOTE — Discharge Summary (Addendum)
Robert Rojas at [place] NAME: Robert Rojas    MR#:  [id number]  DATE OF BIRTH:  10-21-1929  DATE OF ADMISSION:  07/27/2015 ADMITTING PHYSICIAN: Hillary Bow, MD  DATE OF DISCHARGE: 07/31/2015  PRIMARY CARE PHYSICIAN: Perrin Maltese, MD    ADMISSION DIAGNOSIS:  Syncope, unspecified syncope type [R55]  DISCHARGE DIAGNOSIS:  Active Problems:   Syncope NSTEMI Escherichia coli bacteremia SECONDARY DIAGNOSIS:   Past Medical History  Diagnosis Date  . Coronary artery disease   . Essential hypertension   . Hyperlipemia   . Type 2 diabetes mellitus ([place])   . GERD (gastroesophageal reflux disease)   . TIA (transient ischemic attack)   . BCC (basal cell carcinoma)   . Spinal stenosis   . MI (myocardial infarction) ([place]) 01/2011  . CVA (cerebral infarction)     left sided  . Arthralgia     left knee/patella/tibia/fibula  . Prostatitis   . Erectile dysfunction     HOSPITAL COURSE:  81 y.o. male with a known history of CVA, hypertension, diabetes, CAD admitted for syncope. Please see Dr Boykin Reaper dictated [place] for further details.  Patient ruled in for non-ST elevation MI with troponin peak at 6.  patient was also growing Escherichia coli in all 4 out of 4 blood culture bottles, infectious disease consultation was obtained who felt her source of infection, likely abdomen, possibly urine.  He did undergo CT scan of the abdomen which did not show any acute abdominal pathology or source of infection.  ID physician recommended 2 weeks of oral Keflex 500 tid for a total of 14 days from start of abx- stop date will be 11/28.  Cardiology consult was also obtained, but family chose to have conservative management, which was reasonable.  Patient is feeling much better and is being discharged to rehabilitation in fair condition with palliative care following while at the facility. Patient should f/up with PCP to ensure stability after stopping abx.   DISCHARGE CONDITIONS:  good CONSULTS OBTAINED:  Treatment Team:  Dionisio David, MD Adrian Prows, MD  DRUG ALLERGIES:   Allergies  Allergen Reactions  . Lipitor [Atorvastatin]   . Lisinopril    DISCHARGE MEDICATIONS:   Current Discharge Medication List    START taking these medications   Details  cephALEXin (KEFLEX) 500 MG capsule Take 1 capsule (500 mg total) by mouth 3 (three) times daily. Qty: 42 capsule, Refills: 0      CONTINUE these medications which have NOT CHANGED   Details  aspirin 81 MG tablet Take 81 mg by mouth daily.    carvedilol (COREG) 6.25 MG tablet Take 6.25 mg by mouth 2 (two) times daily with a meal.     cetirizine (ZYRTEC) 10 MG tablet TAKE 1 TABLET BY MOUTH AT BEDTIME FOR NASAL DRIP Refills: 5    dabigatran (PRADAXA) 150 MG CAPS capsule Take 150 mg by mouth 2 (two) times daily.    digoxin (LANOXIN) 0.25 MG tablet Take 0.25 mg by mouth daily.    diltiazem (CARDIZEM CD) 240 MG 24 hr capsule Take 120 mg by mouth daily.     furosemide (LASIX) 40 MG tablet Take 40 mg by mouth daily.    LANTUS SOLOSTAR 100 UNIT/ML Solostar Pen Inject 40 Units into the skin daily at 10 pm.     lisinopril (PRINIVIL,ZESTRIL) 2.5 MG tablet Take 2.5 mg by mouth daily.    potassium chloride SA (KLOR-CON M20) 20 MEQ tablet Take 20  mEq by mouth daily.    Blood Glucose Monitoring Suppl (ONE TOUCH ULTRA MINI) W/DEVICE KIT     ONE TOUCH ULTRA TEST test strip     ONETOUCH DELICA LANCETS 57D MISC       STOP taking these medications     fluticasone (FLONASE) 50 MCG/ACT nasal spray      gemfibrozil (LOPID) 600 MG tablet      sertraline (ZOLOFT) 50 MG tablet        DISCHARGE INSTRUCTIONS:   DIET:  Regular diet  DISCHARGE CONDITION:  Good  ACTIVITY:  Activity as tolerated  OXYGEN:  Home Oxygen: No.   Oxygen Delivery: room air  DISCHARGE LOCATION:  nursing home   If you experience worsening of your admission symptoms, develop shortness of breath,  life threatening emergency, suicidal or homicidal thoughts you must seek medical attention immediately by calling 911 or calling your MD immediately  if symptoms less severe.  You Must read complete instructions/literature along with all the possible adverse reactions/side effects for all the Medicines you take and that have been prescribed to you. Take any new Medicines after you have completely understood and accpet all the possible adverse reactions/side effects.   Please note  You were cared for by a hospitalist during your hospital stay. If you have any questions about your discharge medications or the care you received while you were in the hospital after you are discharged, you can call the unit and asked to speak with the hospitalist on call if the hospitalist that took care of you is not available. Once you are discharged, your primary care physician will handle any further medical issues. Please note that NO REFILLS for any discharge medications will be authorized once you are discharged, as it is imperative that you return to your primary care physician (or establish a relationship with a primary care physician if you do not have one) for your aftercare needs so that they can reassess your need for medications and monitor your lab values.    On the day of Discharge:   VITAL SIGNS:  Blood pressure 143/70, pulse 68, temperature 98.3 F (36.8 C), temperature source Oral, resp. rate 20, height 6' 1"  (1.854 m), weight 86.456 kg (190 lb 9.6 oz), SpO2 96 %.  I/O:   Intake/Output Summary (Last 24 hours) at 07/31/15 1229 Last data filed at 07/31/15 1200  Gross per 24 hour  Intake    360 ml  Output   2251 ml  Net  -1891 ml    PHYSICAL EXAMINATION:  GENERAL:  81 y.o.-year-old patient lying in the bed with no acute distress.  EYES: Pupils equal, round, reactive to light and accommodation. No scleral icterus. Extraocular muscles intact.  HEENT: Rojas atraumatic, normocephalic. Oropharynx  and nasopharynx clear.  NECK:  Supple, no jugular venous distention. No thyroid enlargement, no tenderness.  LUNGS: Normal breath sounds bilaterally, no wheezing, rales,rhonchi or crepitation. No use of accessory muscles of respiration.  CARDIOVASCULAR: S1, S2 normal. No murmurs, rubs, or gallops.  ABDOMEN: Soft, non-tender, non-distended. Bowel sounds present. No organomegaly or mass.  EXTREMITIES: No pedal edema, cyanosis, or clubbing.  NEUROLOGIC: Cranial nerves II through XII are intact. Muscle strength 5/5 in all extremities. Sensation intact. Gait not checked.  PSYCHIATRIC: The patient is alert and oriented x 3.  SKIN: No obvious rash, lesion, or ulcer.   DATA REVIEW:   CBC  Recent Labs Lab 07/30/15 0508  WBC 15.2*  HGB 13.6  HCT 41.1  PLT 253  Chemistries   Recent Labs Lab 07/30/15 0508 07/31/15 0506  NA 139  --   K 3.4* 3.4*  CL 107  --   CO2 23  --   GLUCOSE 203*  --   BUN 23*  --   CREATININE 0.91  --   CALCIUM 8.5*  --   AST 43*  --   ALT 49  --   ALKPHOS 98  --   BILITOT 1.0  --     Cardiac Enzymes  Recent Labs Lab 07/29/15 0253  TROPONINI 6.37*    Microbiology Results  Results for orders placed or performed during the hospital encounter of 07/27/15  Culture, blood (routine x 2)     Status: None   Collection Time: 07/28/15  2:51 AM  Result Value Ref Range Status   Specimen Description BLOOD  Final   Special Requests NONE  Final   Culture  Setup Time   Final    GRAM NEGATIVE RODS IN BOTH AEROBIC AND ANAEROBIC BOTTLES CRITICAL RESULT CALLED TO, READ BACK BY AND VERIFIED WITH: RN [facility] LEE 07/28/15 1546    Culture ESCHERICHIA COLI  Final   Report Status 07/30/2015 FINAL  Final   Organism ID, Bacteria ESCHERICHIA COLI  Final      Susceptibility   Escherichia coli - MIC*    AMPICILLIN <=2 SENSITIVE Sensitive     CEFTAZIDIME <=1 SENSITIVE Sensitive     CEFAZOLIN <=4 SENSITIVE Sensitive     CEFTRIAXONE <=1 SENSITIVE Sensitive      CIPROFLOXACIN <=0.25 SENSITIVE Sensitive     GENTAMICIN <=1 SENSITIVE Sensitive     IMIPENEM <=0.25 SENSITIVE Sensitive     TRIMETH/SULFA <=20 SENSITIVE Sensitive     PIP/TAZO Value in next row Sensitive      SENSITIVE<=4    * ESCHERICHIA COLI  Culture, blood (routine x 2)     Status: None   Collection Time: 07/28/15  2:52 AM  Result Value Ref Range Status   Specimen Description BLOOD  Final   Special Requests NONE  Final   Culture  Setup Time   Final    GRAM NEGATIVE RODS CRITICAL RESULT CALLED TO, READ BACK BY AND VERIFIED WITH: RN [facility] LEE 07/28/15 1346 IN BOTH AEROBIC AND ANAEROBIC BOTTLES    Culture ESCHERICHIA COLI  Final   Report Status 07/30/2015 FINAL  Final   Organism ID, Bacteria ESCHERICHIA COLI  Final      Susceptibility   Escherichia coli - MIC*    AMPICILLIN <=2 SENSITIVE Sensitive     CEFTAZIDIME <=1 SENSITIVE Sensitive     CEFAZOLIN <=4 SENSITIVE Sensitive     CEFTRIAXONE <=1 SENSITIVE Sensitive     CIPROFLOXACIN <=0.25 SENSITIVE Sensitive     GENTAMICIN <=1 SENSITIVE Sensitive     IMIPENEM <=0.25 SENSITIVE Sensitive     TRIMETH/SULFA <=20 SENSITIVE Sensitive     PIP/TAZO Value in next row Sensitive      SENSITIVE<=4    * ESCHERICHIA COLI  C difficile quick scan w PCR reflex     Status: None   Collection Time: 07/29/15 10:45 AM  Result Value Ref Range Status   C Diff antigen NEGATIVE NEGATIVE Final   C Diff toxin NEGATIVE NEGATIVE Final   C Diff interpretation Negative for C. difficile  Final  Urine culture     Status: None   Collection Time: 07/29/15  5:15 PM  Result Value Ref Range Status   Specimen Description URINE, CLEAN CATCH  Final   Special Requests NONE  Final   Culture NO GROWTH 2 DAYS  Final   Report Status 07/31/2015 FINAL  Final    RADIOLOGY:  Ct Abdomen Pelvis W Contrast  07/30/2015  CLINICAL DATA:  Elevated white blood cell count. No source for infection. EXAM: CT ABDOMEN AND PELVIS WITH CONTRAST TECHNIQUE: Multidetector CT imaging of  the abdomen and pelvis was performed using the standard protocol following bolus administration of intravenous contrast. CONTRAST:  158m OMNIPAQUE IOHEXOL 300 MG/ML  SOLN COMPARISON:  None. FINDINGS: Lower chest: Small pleural effusions identified. There is overlying dependent changes. Hepatobiliary: Hepatic steatosis noted. No focal liver abnormality. Stone within the gallbladder measures 1.6 cm. No gallbladder wall thickening or pericholecystic fluid. Filling defect within the distal common bile duct is suspected, image 60/series 4 Pancreas: Normal appearance of the pancreas. Spleen: Multiple calcified granulomas noted. Adrenals/Urinary Tract: The adrenal glands are normal. Unremarkable appearance of the right kidney. The left kidney is also normal. The urinary bladder appears normal. Stomach/Bowel: The stomach is within normal limits. The small bowel loops have a normal course and caliber. No obstruction. Normal appearance of the colon. The appendix is visualized and appears normal. Normal appearance of the proximal colon. Numerous distal colonic diverticula identified without acute inflammation. Vascular/Lymphatic: Calcified atherosclerotic disease involves the abdominal aorta. No aneurysm. No enlarged retroperitoneal or mesenteric adenopathy. No enlarged pelvic or inguinal lymph nodes. Reproductive: Prostate gland appears enlarged. Other: There is no ascites or focal fluid collections within the abdomen or pelvis. Musculoskeletal: No aggressive lytic or sclerotic bone lesions. The patient is status post posterior fusion of the L4-5 spinous process ease. There is degenerative disc disease at L5-S1. IMPRESSION: 1. No abscess identified within the abdomen or pelvis. 2. Gallstones. Suspect filling defect within the common bile duct which may reflect choledocholithiasis. If further evaluation is clinically indicated an MRCP may be helpful. 3. Hepatic steatosis. 4. Aortic atherosclerosis. Electronically Signed   By:  TKerby MoorsM.D.   On: 07/30/2015 14:21     Management plans discussed with the patient, family and they are in agreement.  CODE STATUS: .  DO NOT RESUSCITATE   TOTAL TIME TAKING CARE OF THIS PATIENT: 55 minutes.    [facility] Amandalee Lacap M.D on 07/31/2015 at 12:29 PM  Between 7am to 6pm - Pager - [phone]  After 6pm go to [email or web address] - password [email or web address]  Office  [phone] CC: Primary care physician; KPerrin Maltese MD DAdrian Prows MD

## 2015-07-31 NOTE — NC FL2 (Signed)
[place] LEVEL OF CARE SCREENING TOOL     IDENTIFICATION  Patient Name: Robert Rojas Birthdate: 1930-09-11 Sex: male Admission Date (Current Location): 07/27/2015  [place] and [place] Number:  Set designer)   Facility and Address:  [facility], [address], [place], [place] [address]      Provider Number: [id number]  Attending Physician Name and Address:  Max Sane, MD  Relative Name and Phone Number:       Current Level of Care: Hospital Recommended Level of Care: [place] Prior Approval Number:    Date Approved/Denied:   PASRR Number:  [id number])  Discharge Plan: SNF    Current Diagnoses: Patient Active Problem List   Diagnosis Date Noted  . Syncope 07/28/2015  . Erectile dysfunction of organic origin 05/05/2015    Orientation ACTIVITIES/SOCIAL BLADDER RESPIRATION    Self, Place  Active Incontinent Normal  BEHAVIORAL SYMPTOMS/MOOD NEUROLOGICAL BOWEL NUTRITION STATUS      Incontinent Diet (heart healthy)  PHYSICIAN VISITS COMMUNICATION OF NEEDS Height & Weight Skin    Verbally _0  (185.4 cm) 190 lbs. Normal          AMBULATORY STATUS RESPIRATION      Normal      Personal Care Assistance Level of Assistance               Functional Limitations Info  Sight, Hearing, Speech Sight Info: Adequate Hearing Info: Adequate Speech Info: Impaired       SPECIAL CARE FACTORS FREQUENCY  PT (By licensed PT)                   Additional Factors Info  Code Status, Allergies Code Status Info:  (DNR) Allergies Info:  (Lipitor, Lisinopril)           Current Medications (07/31/2015): Current Facility-Administered Medications  Medication Dose Route Frequency Provider Last Rate Last Dose  . acetaminophen (TYLENOL) tablet 650 mg  650 mg Oral Q6H PRN Hillary Bow, MD       Or  . acetaminophen (TYLENOL) suppository 650 mg  650 mg Rectal Q6H PRN Srikar Sudini, MD      . albuterol  (PROVENTIL) (2.5 MG/3ML) 0.083% nebulizer solution 2.5 mg  2.5 mg Nebulization Q2H PRN Srikar Sudini, MD      . ampicillin-sulbactam (UNASYN) 1.5 g in sodium chloride 0.9 % 50 mL IVPB  1.5 g Intravenous Q6H Adrian Prows, MD   1.5 g at 07/31/15 0925  . antiseptic oral rinse (CPC / CETYLPYRIDINIUM CHLORIDE 0.05%) solution 7 mL  7 mL Mouth Rinse BID Hillary Bow, MD   7 mL at 07/31/15 1000  . aspirin EC tablet 81 mg  81 mg Oral Daily Hillary Bow, MD   81 mg at 07/31/15 0924  . carvedilol (COREG) tablet 6.25 mg  6.25 mg Oral BID WC Hillary Bow, MD   6.25 mg at 07/31/15 0847  . digoxin (LANOXIN) tablet 0.25 mg  0.25 mg Oral Daily Hillary Bow, MD   0.25 mg at 07/31/15 0924  . diltiazem (CARDIZEM CD) 24 hr capsule 180 mg  180 mg Oral Daily Hillary Bow, MD   180 mg at 07/31/15 0924  . docusate sodium (COLACE) capsule 100 mg  100 mg Oral BID Hillary Bow, MD   100 mg at 07/31/15 0924  . enoxaparin (LOVENOX) injection 85 mg  1 mg/kg Subcutaneous Q12H Dionisio David, MD   85 mg at 07/31/15 0925  . fluticasone (FLONASE) 50 MCG/ACT nasal spray 1 spray  1 spray Each Nare Daily Hillary Bow, MD   1 spray at 07/31/15 8875  . furosemide (LASIX) tablet 40 mg  40 mg Oral Daily Hillary Bow, MD   40 mg at 07/31/15 0924  . gemfibrozil (LOPID) tablet 600 mg  600 mg Oral BID AC Srikar Sudini, MD   600 mg at 07/31/15 0847  . HYDROcodone-acetaminophen (NORCO/VICODIN) 5-325 MG per tablet 1 tablet  1 tablet Oral Q4H PRN Srikar Sudini, MD      . insulin aspart (novoLOG) injection 0-5 Units  0-5 Units Subcutaneous QHS Hillary Bow, MD   4 Units at 07/29/15 2228  . insulin aspart (novoLOG) injection 0-9 Units  0-9 Units Subcutaneous TID WC Hillary Bow, MD   2 Units at 07/31/15 1220  . insulin glargine (LANTUS) injection 25 Units  25 Units Subcutaneous QHS Max Sane, MD   25 Units at 07/30/15 2103  . ondansetron (ZOFRAN) tablet 4 mg  4 mg Oral Q6H PRN Hillary Bow, MD       Or  . ondansetron (ZOFRAN)  injection 4 mg  4 mg Intravenous Q6H PRN Srikar Sudini, MD      . polyethylene glycol (MIRALAX / GLYCOLAX) packet 17 g  17 g Oral Daily PRN Srikar Sudini, MD      . potassium chloride SA (K-DUR,KLOR-CON) CR tablet 20 mEq  20 mEq Oral Daily Hillary Bow, MD   20 mEq at 07/31/15 0924  . rosuvastatin (CRESTOR) tablet 20 mg  20 mg Oral q1800 Max Sane, MD   20 mg at 07/30/15 1745  . sertraline (ZOLOFT) tablet 50 mg  50 mg Oral Daily Hillary Bow, MD   50 mg at 07/31/15 0924  . sodium chloride 0.9 % injection 3 mL  3 mL Intravenous Q12H Hillary Bow, MD   3 mL at 07/31/15 7972   Do not use this list as official medication orders. Please verify with discharge summary.  Discharge Medications:   Medication List    TAKE these medications        aspirin 81 MG tablet  Take 81 mg by mouth daily.     carvedilol 6.25 MG tablet  Commonly known as:  COREG  Take 6.25 mg by mouth 2 (two) times daily with a meal.     cephALEXin 500 MG capsule  Commonly known as:  KEFLEX  Take 1 capsule (500 mg total) by mouth 3 (three) times daily.     cetirizine 10 MG tablet  Commonly known as:  ZYRTEC  TAKE 1 TABLET BY MOUTH AT BEDTIME FOR NASAL DRIP     digoxin 0.25 MG tablet  Commonly known as:  LANOXIN  Take 0.25 mg by mouth daily.     diltiazem 240 MG 24 hr capsule  Commonly known as:  CARDIZEM CD  Take 120 mg by mouth daily.     furosemide 40 MG tablet  Commonly known as:  LASIX  Take 40 mg by mouth daily.     KLOR-CON M20 20 MEQ tablet  Generic drug:  potassium chloride SA  Take 20 mEq by mouth daily.     LANTUS SOLOSTAR 100 UNIT/ML Solostar Pen  Generic drug:  Insulin Glargine  Inject 40 Units into the skin daily at 10 pm.     lisinopril 2.5 MG tablet  Commonly known as:  PRINIVIL,ZESTRIL  Take 2.5 mg by mouth daily.     ONE TOUCH ULTRA MINI W/DEVICE Kit     ONE TOUCH ULTRA TEST test strip  Generic drug:  glucose blood     ONETOUCH DELICA LANCETS 15U Misc     PRADAXA 150 MG Caps  capsule  Generic drug:  dabigatran  Take 150 mg by mouth 2 (two) times daily.        Relevant Imaging Results:  Relevant Lab Results:  Recent Labs    Additional Information  (SS: [id number])  Maurine Cane, LCSW

## 2015-07-31 NOTE — Progress Notes (Signed)
ANTIBIOTIC CONSULT NOTE - INITIAL  Pharmacy Consult for Unasyn Indication: Bacteremia  Allergies  Allergen Reactions  . Lipitor [Atorvastatin]   . Lisinopril     Patient Measurements: Height: 6\' 1"  (185.4 cm) Weight: 190 lb 9.6 oz (86.456 kg) IBW/kg (Calculated) : 79.9  Vital Signs: Temp: 98.3 F (36.8 C) (11/18 1127) Temp Source: Oral (11/18 1127) BP: 143/70 mmHg (11/18 1127) Pulse Rate: 68 (11/18 1127) Intake/Output from previous day: 11/17 0701 - 11/18 0700 In: 480 [P.O.:480] Out: 2050 [Urine:2050] Intake/Output from this shift: Total I/O In: 120 [P.O.:120] Out: -   Labs:  Recent Labs  07/29/15 0253 07/30/15 0508  WBC 17.6* 15.2*  HGB 13.9 13.6  PLT 252 253  CREATININE 1.11 0.91   Estimated Creatinine Clearance: 69.5 mL/min (by C-G formula based on Cr of 0.91). No results for input(s): VANCOTROUGH, VANCOPEAK, VANCORANDOM, GENTTROUGH, GENTPEAK, GENTRANDOM, TOBRATROUGH, TOBRAPEAK, TOBRARND, AMIKACINPEAK, AMIKACINTROU, AMIKACIN in the last 72 hours.   Microbiology: Recent Results (from the past 720 hour(s))  Culture, blood (routine x 2)     Status: None   Collection Time: 07/28/15  2:51 AM  Result Value Ref Range Status   Specimen Description BLOOD  Final   Special Requests NONE  Final   Culture  Setup Time   Final    GRAM NEGATIVE RODS IN BOTH AEROBIC AND ANAEROBIC BOTTLES CRITICAL RESULT CALLED TO, READ BACK BY AND VERIFIED WITH: RN [facility] LEE 07/28/15 1546    Culture ESCHERICHIA COLI  Final   Report Status 07/30/2015 FINAL  Final   Organism ID, Bacteria ESCHERICHIA COLI  Final      Susceptibility   Escherichia coli - MIC*    AMPICILLIN <=2 SENSITIVE Sensitive     CEFTAZIDIME <=1 SENSITIVE Sensitive     CEFAZOLIN <=4 SENSITIVE Sensitive     CEFTRIAXONE <=1 SENSITIVE Sensitive     CIPROFLOXACIN <=0.25 SENSITIVE Sensitive     GENTAMICIN <=1 SENSITIVE Sensitive     IMIPENEM <=0.25 SENSITIVE Sensitive     TRIMETH/SULFA <=20 SENSITIVE Sensitive    PIP/TAZO Value in next row Sensitive      SENSITIVE<=4    * ESCHERICHIA COLI  Culture, blood (routine x 2)     Status: None   Collection Time: 07/28/15  2:52 AM  Result Value Ref Range Status   Specimen Description BLOOD  Final   Special Requests NONE  Final   Culture  Setup Time   Final    GRAM NEGATIVE RODS CRITICAL RESULT CALLED TO, READ BACK BY AND VERIFIED WITH: RN [facility] LEE 07/28/15 1346 IN BOTH AEROBIC AND ANAEROBIC BOTTLES    Culture ESCHERICHIA COLI  Final   Report Status 07/30/2015 FINAL  Final   Organism ID, Bacteria ESCHERICHIA COLI  Final      Susceptibility   Escherichia coli - MIC*    AMPICILLIN <=2 SENSITIVE Sensitive     CEFTAZIDIME <=1 SENSITIVE Sensitive     CEFAZOLIN <=4 SENSITIVE Sensitive     CEFTRIAXONE <=1 SENSITIVE Sensitive     CIPROFLOXACIN <=0.25 SENSITIVE Sensitive     GENTAMICIN <=1 SENSITIVE Sensitive     IMIPENEM <=0.25 SENSITIVE Sensitive     TRIMETH/SULFA <=20 SENSITIVE Sensitive     PIP/TAZO Value in next row Sensitive      SENSITIVE<=4    * ESCHERICHIA COLI  C difficile quick scan w PCR reflex     Status: None   Collection Time: 07/29/15 10:45 AM  Result Value Ref Range Status   C Diff antigen NEGATIVE NEGATIVE  Final   C Diff toxin NEGATIVE NEGATIVE Final   C Diff interpretation Negative for C. difficile  Final  Urine culture     Status: None   Collection Time: 07/29/15  5:15 PM  Result Value Ref Range Status   Specimen Description URINE, CLEAN CATCH  Final   Special Requests NONE  Final   Culture NO GROWTH 2 DAYS  Final   Report Status 07/31/2015 FINAL  Final    Medical History: Past Medical History  Diagnosis Date  . Coronary artery disease   . Essential hypertension   . Hyperlipemia   . Type 2 diabetes mellitus ([place])   . GERD (gastroesophageal reflux disease)   . TIA (transient ischemic attack)   . BCC (basal cell carcinoma)   . Spinal stenosis   . MI (myocardial infarction) ([place]) 01/2011  . CVA (cerebral infarction)      left sided  . Arthralgia     left knee/patella/tibia/fibula  . Prostatitis   . Erectile dysfunction     Medications:  Scheduled:  . ampicillin-sulbactam (UNASYN) IV  1.5 g Intravenous Q6H  . antiseptic oral rinse  7 mL Mouth Rinse BID  . aspirin EC  81 mg Oral Daily  . carvedilol  6.25 mg Oral BID WC  . digoxin  0.25 mg Oral Daily  . diltiazem  180 mg Oral Daily  . docusate sodium  100 mg Oral BID  . enoxaparin (LOVENOX) injection  1 mg/kg Subcutaneous Q12H  . fluticasone  1 spray Each Nare Daily  . furosemide  40 mg Oral Daily  . gemfibrozil  600 mg Oral BID AC  . insulin aspart  0-5 Units Subcutaneous QHS  . insulin aspart  0-9 Units Subcutaneous TID WC  . insulin glargine  25 Units Subcutaneous QHS  . potassium chloride SA  20 mEq Oral Daily  . rosuvastatin  20 mg Oral q1800  . sertraline  50 mg Oral Daily  . sodium chloride  3 mL Intravenous Q12H   Infusions:    Assessment: 81 y/o M ordered empiric Unasyn for Ecoli bacteremia.   Goal of Therapy:  Resolution of infection  Plan:  Will continue Unasyn 1.5 g iv q 6 hours. Will continue to follow renal function. Will discuss with MD about possibly de-escalating to Cefazolin.   Gissele Narducci D 07/31/2015,12:09 PM

## 2015-07-31 NOTE — Progress Notes (Signed)
SUBJECTIVE: No chest pain or shortness of breath    Filed Vitals:   07/30/15 1157 07/30/15 2028 07/31/15 0500 07/31/15 0845  BP: 138/91 136/61  158/90  Pulse: 81 74  66  Temp: 97.8 F (36.6 C) 98.7 F (37.1 C)  98.2 F (36.8 C)  TempSrc: Oral Oral  Oral  Resp: 18 24  18   Height:      Weight:   190 lb 9.6 oz (86.456 kg)   SpO2: 95% 94%  97%    Intake/Output Summary (Last 24 hours) at 07/31/15 0905 Last data filed at 07/31/15 0529  Gross per 24 hour  Intake    240 ml  Output   2050 ml  Net  -1810 ml    LABS: Basic Metabolic Panel:  Recent Labs  07/29/15 0253 07/30/15 0508 07/31/15 0506  NA 138 139  --   K 3.7 3.4* 3.4*  CL 106 107  --   CO2 24 23  --   GLUCOSE 271* 203*  --   BUN 22* 23*  --   CREATININE 1.11 0.91  --   CALCIUM 8.8* 8.5*  --    Liver Function Tests:  Recent Labs  07/30/15 0508  AST 43*  ALT 49  ALKPHOS 98  BILITOT 1.0  PROT 6.7  ALBUMIN 3.2*   No results for input(s): LIPASE, AMYLASE in the last 72 hours. CBC:  Recent Labs  07/29/15 0253 07/30/15 0508  WBC 17.6* 15.2*  HGB 13.9 13.6  HCT 41.0 41.1  MCV 91.8 92.0  PLT 252 253   Cardiac Enzymes:  Recent Labs  07/28/15 1731 07/29/15 0253  TROPONINI 5.21* 6.37*   BNP: Invalid input(s): POCBNP D-Dimer: No results for input(s): DDIMER in the last 72 hours. Hemoglobin A1C:  Recent Labs  07/28/15 1731  HGBA1C 7.0*   Fasting Lipid Panel: No results for input(s): CHOL, HDL, LDLCALC, TRIG, CHOLHDL, LDLDIRECT in the last 72 hours. Thyroid Function Tests: No results for input(s): TSH, T4TOTAL, T3FREE, THYROIDAB in the last 72 hours.  Invalid input(s): FREET3 Anemia Panel: No results for input(s): VITAMINB12, FOLATE, FERRITIN, TIBC, IRON, RETICCTPCT in the last 72 hours.   PHYSICAL EXAM General: Well developed, well nourished, in no acute distress HEENT:  Normocephalic and atramatic Neck:  No JVD.  Lungs: Clear bilaterally to auscultation and percussion. Heart:  HRRR . Normal S1 and S2 without gallops or murmurs.  Abdomen: Bowel sounds are positive, abdomen soft and non-tender  Msk:  Back normal, normal gait. Normal strength and tone for age. Extremities: No clubbing, cyanosis or edema.   Neuro: Alert and oriented X 3. Psych:  Good affect, responds appropriately  TELEMETRY: Sinus rhythm  ASSESSMENT AND PLAN: Non-STEMI with elevated troponin up to 6 but denies any chest pain with no acute EKG changes with history of sepsis. Patient and and the family refuse cardiac catheterization will treat the patient medically. May go home with follow-up in the office next week.  Active Problems:   Syncope    Ante Arredondo A, MD, [facility] 07/31/2015 9:05 AM

## 2015-07-31 NOTE — Progress Notes (Signed)
Patient resting in bed at this time, denies pain or discomfort, VSS mood calm, no acute distress noted.

## 2015-07-31 NOTE — Progress Notes (Signed)
Spoke with Rosana Hoes., [employer] rep at [phone], to notify of non-emergent EMS transport.  Auth notification reference given as [id number].   Service date range good from 07/31/15 - 10/29/15.   Gap exception requested to determine if services can be considered at an in-network level.

## 2015-07-31 NOTE — Consult Note (Signed)
ANTICOAGULATION CONSULT NOTE - Follow up  Pharmacy Consult for lovenox Indication: chest pain/ACS  Allergies  Allergen Reactions  . Lipitor [Atorvastatin]   . Lisinopril     Patient Measurements: Height: 6\' 1"  (185.4 cm) Weight: 190 lb 9.6 oz (86.456 kg) IBW/kg (Calculated) : 79.9 Heparin Dosing Weight:   Vital Signs: Temp: 98.3 F (36.8 C) (11/18 1127) Temp Source: Oral (11/18 1127) BP: 143/70 mmHg (11/18 1127) Pulse Rate: 68 (11/18 1127)  Labs:  Recent Labs  07/28/15 1731 07/29/15 0253 07/30/15 0508  HGB  --  13.9 13.6  HCT  --  41.0 41.1  PLT  --  252 253  CREATININE  --  1.11 0.91  TROPONINI 5.21* 6.37*  --     Estimated Creatinine Clearance: 69.5 mL/min (by C-G formula based on Cr of 0.91).   Medical History: Past Medical History  Diagnosis Date  . Coronary artery disease   . Essential hypertension   . Hyperlipemia   . Type 2 diabetes mellitus ([place])   . GERD (gastroesophageal reflux disease)   . TIA (transient ischemic attack)   . BCC (basal cell carcinoma)   . Spinal stenosis   . MI (myocardial infarction) ([place]) 01/2011  . CVA (cerebral infarction)     left sided  . Arthralgia     left knee/patella/tibia/fibula  . Prostatitis   . Erectile dysfunction     Medications:  Scheduled:  . ampicillin-sulbactam (UNASYN) IV  1.5 g Intravenous Q6H  . antiseptic oral rinse  7 mL Mouth Rinse BID  . aspirin EC  81 mg Oral Daily  . carvedilol  6.25 mg Oral BID WC  . digoxin  0.25 mg Oral Daily  . diltiazem  180 mg Oral Daily  . docusate sodium  100 mg Oral BID  . enoxaparin (LOVENOX) injection  1 mg/kg Subcutaneous Q12H  . fluticasone  1 spray Each Nare Daily  . furosemide  40 mg Oral Daily  . gemfibrozil  600 mg Oral BID AC  . insulin aspart  0-5 Units Subcutaneous QHS  . insulin aspart  0-9 Units Subcutaneous TID WC  . insulin glargine  25 Units Subcutaneous QHS  . potassium chloride SA  20 mEq Oral Daily  . rosuvastatin  20 mg Oral q1800  .  sertraline  50 mg Oral Daily  . sodium chloride  3 mL Intravenous Q12H    Assessment: Pt is a 81 year old male with a NSTEMI. Pt on Pradaxa PTA. Pharmacy is consulted to dose lovenox for NSTEMI  Goal of Therapy:  Anti-Xa level 0.6-1 units/ml 4hrs after LMWH dose given Monitor platelets by anticoagulation protocol: Yes   Plan:  Lovenox 1mg /kg q 12 hours - will continue enoxaparin 85 mg Sq q12h  Hgb/Plt has been stable.   Pharmacy to continue to monitor renal function/cbc  Albertia Carvin D 07/31/2015,12:08 PM

## 2015-07-31 NOTE — Progress Notes (Signed)
Clinical Social Worker informed by Max Sane, MD that patient is medically ready to discharge to SNF, Patient and his daughter are  in a agreement with plan.  Call to [facility] to confirm that patient's bed is ready. Provided patient's room number is 507 and number to call for report [phone] . All discharge information faxed to  Facility. DNR added to DC packet.   RN will call report and patient will discharge to [facility] via EMS.  Casimer Lanius. Latanya Presser, MSW Clinical Social Work Department [phone] 2:18 PM

## 2015-07-31 NOTE — Discharge Instructions (Signed)
Bacteremia °Bacteremia is the presence of bacteria in the blood. A small amount of bacteria may not cause any symptoms. °Sometimes, the bacteria spread and cause infection in other parts of the body, such as the heart, joints, bones, or brain. Having a great amount of bacteria can cause a serious, sometimes life-threatening infection called sepsis. °CAUSES °This condition is caused by bacteria that get into the blood. Bacteria can enter the blood: °· During a dental or medical procedure. °· After you brush your teeth so hard that the gums bleed. °· Through a scrape or cut on your skin. °More severe types of bacteremia can be caused by: °· A bacterial infection, such as pneumonia, that spreads to the blood. °· Using a dirty needle. °RISK FACTORS °This condition is more likely to develop in: °· Children and elderly adults. °· People who have a long-lasting (chronic) disease or medical condition. °· People who have an artificial joint or heart valve. °· People who have heart valve disease. °· People who have a tube, such as a catheter or IV tube, that has been inserted for a medical treatment. °· People who have a weak body defense system (immune system). °· People who use IV drugs. °SYMPTOMS °Usually, this condition does not cause symptoms when it is mild. When it is more serious, it may cause: °· Fever. °· Chills. °· Racing heart. °· Shortness of breath. °· Dizziness. °· Weakness. °· Confusion. °· Nausea or vomiting. °· Diarrhea. °Bacteremia that has spread to other parts of the body may cause symptoms in those areas. °DIAGNOSIS °This condition may be diagnosed with a physical exam and tests, such as: °· A complete blood count (CBC). This test looks for signs of infection. °· Blood cultures. These look for bacteria in your blood. °· Tests of any IV tubes. These look for a source of infection. °· Urine tests. °· Imaging tests, such as an X-ray, CT scan, MRI, or heart ultrasound. °TREATMENT °If the condition is mild,  treatment is usually not needed. Usually, the body's immune system will remove the bacteria. If the condition is more serious, it may be treated with: °· Antibiotic medicines through an IV tube. These may be given for about 2 weeks. At first, the antibiotic that is given may kill most types of blood bacteria. If your test results show that a certain kind of bacteria is causing problems, the antibiotic may be changed to kill only the bacteria that are causing problems. °· Antibiotics taken by mouth. °· Removing any catheter or IV tube that is a source of infection. °· Blood pressure and breathing support, if needed. °· Surgery to control the source or spread of infection, if needed. °HOME CARE INSTRUCTIONS °· Take over-the-counter and prescription medicines only as told by your health care provider. °· If you were prescribed an antibiotic, take it as told by your health care provider. Do not stop taking the antibiotic even if you start to feel better. °· Rest at home until your condition is under control. °· Drink enough fluid to keep your urine clear or pale yellow. °· Keep all follow-up visits as told by your health care provider. This is important. °PREVENTION °Take these actions to help prevent future episodes of bacteremia: °· Get all vaccinations as recommended by your health care provider. °· Clean and cover scrapes or cuts. °· Bathe regularly. °· Wash your hands often. °· Before any dental or surgical procedure, ask your health care provider if you should take an antibiotic. °SEEK MEDICAL   CARE IF:  Your symptoms get worse.  You continue to have symptoms after treatment.  You develop new symptoms after treatment. SEEK IMMEDIATE MEDICAL CARE IF:  You have chest pain or trouble breathing.  You develop confusion, dizziness, or weakness.  You develop pale skin.   This information is not intended to replace advice given to you by your health care provider. Make sure you discuss any questions you have  with your health care provider.   Document Released: 06/12/2006 Document Revised: 05/20/2015 Document Reviewed: 11/01/2014 Elsevier Interactive Patient Education 2016 [place].  Heart Attack A heart attack (myocardial infarction, MI) causes damage to your heart that cannot be fixed. A heart attack can happen when a heart (coronary) artery becomes blocked or narrowed. This cuts off the blood supply and oxygen to your heart. When one or more of your coronary arteries becomes blocked, that area of your heart begins to die. This causes the pain you feel during a heart attack. Heart attack pain can also occur in one part of the body but be felt in another part of the body (referred pain). You may feel referred heart attack pain in your left arm, neck, or jaw. Pain may even be felt in the right arm. CAUSES  Many conditions can cause a heart attack. These include:   Atherosclerosis. This is when a fatty substance (plaque) gradually builds up in the arteries. This buildup can block or reduce the blood supply to one or more of the heart arteries.  A blood clot. A blood clot can develop suddenly when plaque breaks up (ruptures) within a heart artery. A blood clot can block the heart artery, which prevents blood flow to the heart.   Severe tightening (spasm) of the heart artery. This cuts off blood flow through the artery.  RISK FACTORS People at risk for heart attack usually have one or more of the following risk factors:   High blood pressure (hypertension).  High cholesterol.  Smoking.  Being male.  Being overweight or obese.  Older aged.   A family history of heart disease.  Lack of exercise.  Diabetes.  Stress.  Drinking too much alcohol.  Using illegal street drugs, such as cocaine and methamphetamines. SYMPTOMS  Heart attack symptoms can vary from person to person. Symptoms depend on factors like gender and age.   In both men and women, heart attack symptoms can  include the following:   Chest pain. This may feel like crushing, squeezing, or a feeling of pressure.  Shortness of breath.  Heartburn or indigestion with or without vomiting, shortness of breath, or sweating.  Sudden cold sweats.  Sudden light-headedness.  Upper back pain.   Women can have unique heart attack symptoms, such as:   Unexplained feelings of nervousness or anxiety.  Discomfort between the shoulder blades or upper back.  Tingling in the hands and arms.   Older people (of both genders) can have subtle heart attack symptoms, such as:   Sweating.  Shortness of breath.  General tiredness or not feeling well.  DIAGNOSIS  Diagnosing a heart attack involves several tests. They include:   An assessment of your vital signs. This includes checking your:  Heart rhythm.  Blood pressure.  Breathing rate.  Oxygen level.   An ECG (electrocardiogram) to measure the electrical activity of your heart.  Blood tests called cardiac markers. In these tests, blood is drawn at scheduled times to check for the specific proteins or enzymes released by damaged heart muscle.  A chest X-ray.  An echocardiogram to evaluate heart motion and blood flow.  Coronary angiography to look at the heart arteries.  TREATMENT  Treatment for a heart attack may include:   Medicine that breaks up or dissolves blood clots in the heart artery.  Angioplasty.  Cardiac stent placement.  Intra-aortic balloon pump therapy (IABP).  Open heart surgery (coronary artery bypass graft, CABG). HOME CARE INSTRUCTIONS  Take medicines only as directed by your health care provider. You may need to take medicine after a heart attack to:   Keep your blood from clotting too easily.  Control your blood pressure.  Lower your cholesterol.  Control abnormal heart rhythms.   Do not take the following medicines unless your health care provider approves:  Nonsteroidal anti-inflammatory  drugs (NSAIDs), such as ibuprofen, naproxen, or celecoxib.  Vitamin supplements that contain vitamin A, vitamin E, or both.  Hormone replacement therapy that contains estrogen with or without progestin.  Make lifestyle changes as directed by your health care provider. These may include:   Quitting smoking, if you smoke.  Getting regular exercise. Ask your health care provider to suggest some activities that are safe for you.  Eating a heart-healthy diet. A registered dietitian can help you learn healthy eating options.  Maintaining a healthy weight.   Managing diabetes, if necessary.  Reducing stress.  Limiting how much alcohol you drink. SEEK IMMEDIATE MEDICAL CARE IF:   You have sudden, unexplained chest discomfort.  You have sudden, unexplained discomfort in your arms, back, neck, or jaw.  You have shortness of breath at any time.  You suddenly start to sweat or your skin gets clammy.  You feel nauseous or vomit.  You suddenly feel light-headed or dizzy.  Your heart begins to beat fast or feels like it is skipping beats. These symptoms may represent a serious problem that is an emergency. Do not wait to see if the symptoms will go away. Get medical help right away. Call your local emergency services (911 in the [place] Do not drive yourself to the hospital.   This information is not intended to replace advice given to you by your health care provider. Make sure you discuss any questions you have with your health care provider.   Document Released: 08/29/2005 Document Revised: 09/19/2014 Document Reviewed: 11/01/2013 Elsevier Interactive Patient Education [employer].

## 2015-07-31 NOTE — Clinical Social Work Placement (Signed)
   CLINICAL SOCIAL WORK PLACEMENT  NOTE  Date:  07/31/2015  Patient Details  Name: Robert Rojas MRN: [id number] Date of Birth: [birth year]  Clinical Social Work is seeking post-discharge placement for this patient at the [place] level of care (*CSW will initial, date and re-position this form in  chart as items are completed):  Yes   Patient/family provided with [place] Work Department's list of facilities offering this level of care within the geographic area requested by the patient (or if unable, by the patient's family).  Yes   Patient/family informed of their freedom to choose among providers that offer the needed level of care, that participate in Medicare, Medicaid or managed care program needed by the patient, have an available bed and are willing to accept the patient.  Yes   Patient/family informed of [facility]'s ownership interest in [facility] and [facility], as well as of the fact that they are under no obligation to receive care at these facilities.  PASRR submitted to EDS on       PASRR number received on       Existing PASRR number confirmed on 07/30/15     FL2 transmitted to all facilities in geographic area requested by pt/family on 07/30/15     FL2 transmitted to all facilities within larger geographic area on       Patient informed that his/her managed care company has contracts with or will negotiate with certain facilities, including the following:        Yes   Patient/family informed of bed offers received.  Patient chooses bed at  [facility])     Physician recommends and patient chooses bed at      Patient to be transferred to  [facility]) on 07/31/15.  Patient to be transferred to facility by  (EMS)     Patient family notified on 07/31/15 of transfer.  Name of family member notified:   (Daughter  Carol-Ann)     PHYSICIAN Please sign FL2, Please sign DNR     Additional Comment:     _______________________________________________ Maurine Cane, LCSW 07/31/2015, 1:50 PM

## 2015-12-02 ENCOUNTER — Emergency Department
Admission: EM | Admit: 2015-12-02 | Discharge: 2015-12-02 | Disposition: A | Discharge status: Home / Self Care | Payer: Medicare Other | Attending: Emergency Medicine | Admitting: Emergency Medicine

## 2015-12-02 ENCOUNTER — Encounter: Payer: Self-pay | Admitting: Emergency Medicine

## 2015-12-02 DIAGNOSIS — I252 Old myocardial infarction: Secondary | ICD-10-CM | POA: Insufficient documentation

## 2015-12-02 DIAGNOSIS — Z79899 Other long term (current) drug therapy: Secondary | ICD-10-CM | POA: Diagnosis not present

## 2015-12-02 DIAGNOSIS — Z955 Presence of coronary angioplasty implant and graft: Secondary | ICD-10-CM | POA: Diagnosis not present

## 2015-12-02 DIAGNOSIS — Z8673 Personal history of transient ischemic attack (TIA), and cerebral infarction without residual deficits: Secondary | ICD-10-CM | POA: Insufficient documentation

## 2015-12-02 DIAGNOSIS — R55 Syncope and collapse: Secondary | ICD-10-CM | POA: Diagnosis not present

## 2015-12-02 DIAGNOSIS — I2510 Atherosclerotic heart disease of native coronary artery without angina pectoris: Secondary | ICD-10-CM | POA: Insufficient documentation

## 2015-12-02 DIAGNOSIS — Z7984 Long term (current) use of oral hypoglycemic drugs: Secondary | ICD-10-CM | POA: Insufficient documentation

## 2015-12-02 DIAGNOSIS — B372 Candidiasis of skin and nail: Secondary | ICD-10-CM | POA: Diagnosis not present

## 2015-12-02 DIAGNOSIS — Z7982 Long term (current) use of aspirin: Secondary | ICD-10-CM | POA: Diagnosis not present

## 2015-12-02 DIAGNOSIS — E785 Hyperlipidemia, unspecified: Secondary | ICD-10-CM | POA: Insufficient documentation

## 2015-12-02 DIAGNOSIS — E119 Type 2 diabetes mellitus without complications: Secondary | ICD-10-CM | POA: Insufficient documentation

## 2015-12-02 DIAGNOSIS — K625 Hemorrhage of anus and rectum: Secondary | ICD-10-CM | POA: Diagnosis present

## 2015-12-02 DIAGNOSIS — C4491 Basal cell carcinoma of skin, unspecified: Secondary | ICD-10-CM | POA: Insufficient documentation

## 2015-12-02 DIAGNOSIS — Z7901 Long term (current) use of anticoagulants: Secondary | ICD-10-CM | POA: Insufficient documentation

## 2015-12-02 DIAGNOSIS — I10 Essential (primary) hypertension: Secondary | ICD-10-CM | POA: Insufficient documentation

## 2015-12-02 DIAGNOSIS — M4800 Spinal stenosis, site unspecified: Secondary | ICD-10-CM | POA: Insufficient documentation

## 2015-12-02 LAB — CBC
HEMATOCRIT: 39.5 % — AB (ref 40.0–52.0)
Hemoglobin: 13.9 g/dL (ref 13.0–18.0)
MCH: 30.6 pg (ref 26.0–34.0)
MCHC: 35 g/dL (ref 32.0–36.0)
MCV: 87.4 fL (ref 80.0–100.0)
Platelets: 234 10*3/uL (ref 150–440)
RBC: 4.52 MIL/uL (ref 4.40–5.90)
RDW: 15.4 % — ABNORMAL HIGH (ref 11.5–14.5)
WBC: 10.3 10*3/uL (ref 3.8–10.6)

## 2015-12-02 LAB — COMPREHENSIVE METABOLIC PANEL
ALT: 17 U/L (ref 17–63)
AST: 28 U/L (ref 15–41)
Albumin: 3.8 g/dL (ref 3.5–5.0)
Alkaline Phosphatase: 65 U/L (ref 38–126)
Anion gap: 9 (ref 5–15)
BUN: 13 mg/dL (ref 6–20)
CHLORIDE: 99 mmol/L — AB (ref 101–111)
CO2: 21 mmol/L — ABNORMAL LOW (ref 22–32)
Calcium: 8.5 mg/dL — ABNORMAL LOW (ref 8.9–10.3)
Creatinine, Ser: 1.04 mg/dL (ref 0.61–1.24)
Glucose, Bld: 408 mg/dL — ABNORMAL HIGH (ref 65–99)
POTASSIUM: 3.8 mmol/L (ref 3.5–5.1)
Sodium: 129 mmol/L — ABNORMAL LOW (ref 135–145)
Total Bilirubin: 0.5 mg/dL (ref 0.3–1.2)
Total Protein: 7 g/dL (ref 6.5–8.1)

## 2015-12-02 LAB — URINALYSIS COMPLETE WITH MICROSCOPIC (ARMC ONLY)
BILIRUBIN URINE: NEGATIVE
Bacteria, UA: NONE SEEN
HGB URINE DIPSTICK: NEGATIVE
Ketones, ur: NEGATIVE mg/dL
LEUKOCYTES UA: NEGATIVE
NITRITE: NEGATIVE
Protein, ur: NEGATIVE mg/dL
SPECIFIC GRAVITY, URINE: 1.026 (ref 1.005–1.030)
WBC, UA: NONE SEEN WBC/hpf (ref 0–5)
pH: 5 (ref 5.0–8.0)

## 2015-12-02 LAB — TYPE AND SCREEN
ABO/RH(D): O NEG
Antibody Screen: NEGATIVE

## 2015-12-02 LAB — ABO/RH: ABO/RH(D): O NEG

## 2015-12-02 LAB — GLUCOSE, CAPILLARY: Glucose-Capillary: 273 mg/dL — ABNORMAL HIGH (ref 65–99)

## 2015-12-02 MED ORDER — NYSTATIN 100000 UNIT/GM EX POWD: CUTANEOUS | Status: AC

## 2015-12-02 MED ORDER — NYSTATIN 100000 UNIT/GM EX POWD
Freq: Once | CUTANEOUS | Status: AC
  Administered 2015-12-02: via TOPICAL
  Filled 2015-12-02: qty 15

## 2015-12-02 MED ORDER — SODIUM CHLORIDE 0.9 % IV BOLUS (SEPSIS): 1000.0000 mL | Freq: Once | INTRAVENOUS | Status: DC

## 2015-12-02 NOTE — ED Notes (Signed)
C/O bleeding from ?rectum all day today.  States same symptoms happened last year, but patient states did not seek medical care at that time and the symptoms just stopped.

## 2015-12-02 NOTE — ED Notes (Signed)
Pharmacy called and notified of need of nystatin. States they will send it up when its ready.

## 2015-12-02 NOTE — ED Notes (Signed)
In and out cath completed by this RN and Lanny Hurst, Paramedic. Patient tolerated well. Clear yellow urine return noted.

## 2015-12-02 NOTE — Discharge Instructions (Signed)
It appears that your bleeding is all thing from skin breakdown from yeast infection in your groin. It is very important to let this area dry out as much as possible. Return to the ER for new or worsening bleeding, pain, fever, or for any other concerns. Please have your doctor recheck your blood sugar. You should also get a colonoscopy scheduled as there may have been trace blood in your stool and you have never had a colonoscopy.   Cutaneous Candidiasis Cutaneous candidiasis is a condition in which there is an overgrowth of yeast (candida) on the skin. Yeast normally live on the skin, but in small enough numbers not to cause any symptoms. In certain cases, increased growth of the yeast may cause an actual yeast infection. This kind of infection usually occurs in areas of the skin that are constantly warm and moist, such as the armpits or the groin. Yeast is the most common cause of diaper rash in babies and in people who cannot control their bowel movements (incontinence). CAUSES  The fungus that most often causes cutaneous candidiasis is Candida albicans. Conditions that can increase the risk of getting a yeast infection of the skin include:  Obesity.  Pregnancy.  Diabetes.  Taking antibiotic medicine.  Taking birth control pills.  Taking steroid medicines.  Thyroid disease.  An iron or zinc deficiency.  Problems with the immune system. SYMPTOMS   Red, swollen area of the skin.  Bumps on the skin.  Itchiness. DIAGNOSIS  The diagnosis of cutaneous candidiasis is usually based on its appearance. Light scrapings of the skin may also be taken and viewed under a microscope to identify the presence of yeast. TREATMENT  Antifungal creams may be applied to the infected skin. In severe cases, oral medicines may be needed.  HOME CARE INSTRUCTIONS   Keep your skin clean and dry.  Maintain a healthy weight.  If you have diabetes, keep your blood sugar under control. SEEK IMMEDIATE  MEDICAL CARE IF:  Your rash continues to spread despite treatment.  You have a fever, chills, or abdominal pain.   This information is not intended to replace advice given to you by your health care provider. Make sure you discuss any questions you have with your health care provider.   Document Released: 05/17/2011 Document Revised: 11/21/2011 Document Reviewed: 03/02/2015 Elsevier Interactive Patient Education [employer].

## 2015-12-02 NOTE — ED Notes (Signed)
Patient states he noticed blood in his stool since yesterday. States his rectum burns.

## 2015-12-02 NOTE — ED Provider Notes (Signed)
[facility] Emergency Department Provider Note ____________________________________________  Time seen: Approximately 11:23 PM  I have reviewed the triage vital signs and the nursing notes.   HISTORY  Chief Complaint Rectal Bleeding    HPI Robert Rojas is a 82 y.o. male is a history of coronary artery disease, diabetes, TIA on dabigitran who presents due to seeing blood in the toilet and his depends for the past 2 day.  He thinks it is coming from his rectum. He denies any rectal pain, abdominal pain, chest pain, fever, recent illness. He has not been lightheaded. At baseline he ambulates with a walker.  He lives with his wife of 30 years who is present during H&P.  Past Medical History  Diagnosis Date  . Coronary artery disease   . Essential hypertension   . Hyperlipemia   . Type 2 diabetes mellitus ([place])   . GERD (gastroesophageal reflux disease)   . TIA (transient ischemic attack)   . BCC (basal cell carcinoma)   . Spinal stenosis   . MI (myocardial infarction) ([place]) 01/2011  . CVA (cerebral infarction)     left sided  . Arthralgia     left knee/patella/tibia/fibula  . Prostatitis   . Erectile dysfunction     Patient Active Problem List   Diagnosis Date Noted  . Syncope 07/28/2015  . Erectile dysfunction of organic origin 05/05/2015    Past Surgical History  Procedure Laterality Date  . Coronary angioplasty with stent placement  2007  . Lumbar laminectomy    . Knee surgery Left     Current Outpatient Rx  Name  Route  Sig  Dispense  Refill  . aspirin 81 MG tablet   Oral   Take 81 mg by mouth daily.         . carvedilol (COREG) 6.25 MG tablet   Oral   Take 6.25 mg by mouth 2 (two) times daily with a meal.          . cetirizine (ZYRTEC) 10 MG tablet      TAKE 1 TABLET BY MOUTH AT BEDTIME FOR NASAL DRIP      5   . dabigatran (PRADAXA) 150 MG CAPS capsule   Oral   Take 150 mg by mouth 2 (two) times daily.         .  digoxin (LANOXIN) 0.25 MG tablet   Oral   Take 0.25 mg by mouth daily.         Marland Kitchen diltiazem (CARDIZEM CD) 240 MG 24 hr capsule   Oral   Take 120 mg by mouth daily.          . furosemide (LASIX) 40 MG tablet   Oral   Take 40 mg by mouth daily.         Marland Kitchen HUMALOG KWIKPEN 100 UNIT/ML KiwkPen      USE AS DIRECTED FOR SLIDING SCALE FOUR TIMES DAILY. MAX OF 48 UNITS PER DAY      3     Dispense as written.   Marland Kitchen JANUVIA 100 MG tablet   Oral   Take 100 mg by mouth daily.      5     Dispense as written.   Marland Kitchen LANTUS SOLOSTAR 100 UNIT/ML Solostar Pen   Subcutaneous   Inject 40 Units into the skin daily at 10 pm.            Dispense as written.   Marland Kitchen lisinopril (PRINIVIL,ZESTRIL) 2.5 MG tablet   Oral  Take 2.5 mg by mouth daily.         . potassium chloride SA (KLOR-CON M20) 20 MEQ tablet   Oral   Take 20 mEq by mouth daily.         . pravastatin (PRAVACHOL) 80 MG tablet   Oral   Take 80 mg by mouth every evening.      5   . XARELTO 20 MG TABS tablet   Oral   Take 1 tablet by mouth daily.      2     Dispense as written.   . cephALEXin (KEFLEX) 500 MG capsule   Oral   Take 1 capsule (500 mg total) by mouth 3 (three) times daily. Patient not taking: Reported on 12/02/2015   42 capsule   0     Allergies Lipitor and Lisinopril  Family History  Problem Relation Age of Onset  . Kidney disease Neg Hx   . Prostate cancer Neg Hx     Social History Social History  Substance Use Topics  . Smoking status: Never Smoker   . Smokeless tobacco: None  . Alcohol Use: No    Review of Systems Constitutional: No fever/chills Eyes: No visual changes. ENT: No sore throat. Cardiovascular: Denies chest pain. Respiratory: Denies shortness of breath. Gastrointestinal: No abdominal pain.  No nausea, no vomiting.  No diarrhea.  No constipation. Genitourinary: Negative for dysuria. Musculoskeletal: Negative for back pain. Skin: Negative for rash. Neurological:  Negative for headaches, focal weakness or numbness.  10-point ROS otherwise negative.  ____________________________________________   PHYSICAL EXAM:  VITAL SIGNS: ED Triage Vitals  Enc Vitals Group     BP 12/02/15 2004 153/68 mmHg     Pulse Rate 12/02/15 2004 62     Resp 12/02/15 2004 18     Temp --      Temp Source 12/02/15 2004 Oral     SpO2 12/02/15 2004 98 %     Weight 12/02/15 2004 195 lb (88.451 kg)     Height 12/02/15 2004 6\' 1"  (1.854 m)     Head Cir --      Peak Flow --      Pain Score 12/02/15 2005 0     Pain Loc --      Pain Edu? --      Excl. in [place]? --    Constitutional: Alert and oriented. Well appearing and in no acute distress. Eyes: Conjunctivae are normal. PERRL. EOMI. Head: Atraumatic. Nose: No congestion/rhinnorhea. Mouth/Throat: Mucous membranes are moist.  Oropharynx non-erythematous. Neck: No stridor.   Cardiovascular: Normal rate, regular rhythm. Grossly normal heart sounds.  Good peripheral circulation. Respiratory: Normal respiratory effort.  No retractions. Lungs CTAB. Gastrointestinal: Soft and nontender. No distention. No abdominal bruits. No CVA tenderness. GU: There is blood oozing from the hair follicles over the scrotum. The scrotum is moderately erythematous consistent with candidal infection. No blood is seen at the urethral meatus. On rectal exam patient had brown stool. It was heme positive but may have had blood from the skin next and on exam. Musculoskeletal: No lower extremity tenderness nor edema.   Neurologic:  Normal speech and language. No gross focal neurologic deficits are appreciated. No gait instability. Skin:  Skin is warm, dry and intact. No rash noted. Psychiatric: Mood and affect are normal. Speech and behavior are normal.  ____________________________________________   LABS (all labs ordered are listed, but only abnormal results are displayed)  Labs Reviewed  COMPREHENSIVE METABOLIC PANEL - Abnormal; Notable for the  following:    Sodium 129 (*)    Chloride 99 (*)    CO2 21 (*)    Glucose, Bld 408 (*)    Calcium 8.5 (*)    All other components within normal limits  CBC - Abnormal; Notable for the following:    HCT 39.5 (*)    RDW 15.4 (*)    All other components within normal limits  URINALYSIS COMPLETEWITH MICROSCOPIC ([facility] ONLY) - Abnormal; Notable for the following:    Color, Urine YELLOW (*)    APPearance CLEAR (*)    Glucose, UA >500 (*)    Squamous Epithelial / LPF 0-5 (*)    All other components within normal limits  POC OCCULT BLOOD, ED  TYPE AND SCREEN  ABO/RH   _____________________________________________   PROCEDURES  Procedure(s) performed: Dermabond applied to areas that were using on the scrotum.  ____________________________________________   INITIAL IMPRESSION / ASSESSMENT AND PLAN / ED COURSE  Pertinent labs & imaging results that were available during my care of the patient were reviewed by me and considered in my medical decision making (see chart for details).  It appears that the bleeding is actually coming from patient's skin and not from the urine or stool due to skin breakdown from candidal infection. I will start the patient on nystatin. He is also hyperglycemic.  Will recheck blood sugar after IVF. ____________________________________________   FINAL CLINICAL IMPRESSION(S) / ED DIAGNOSES Candidal infection scrotum  Nystatin powder      Ponciano Ort, MD 12/02/15 2328

## 2015-12-02 NOTE — ED Notes (Signed)

## 2016-10-04 ENCOUNTER — Emergency Department: Payer: Medicare Other

## 2016-10-04 ENCOUNTER — Inpatient Hospital Stay
Admission: EM | Admit: 2016-10-04 | Discharge: 2016-10-07 | DRG: 871 | Disposition: A | Discharge status: Skilled Nursing Facility | Payer: Medicare Other | Attending: Internal Medicine | Admitting: Internal Medicine

## 2016-10-04 ENCOUNTER — Encounter: Payer: Self-pay | Admitting: Emergency Medicine

## 2016-10-04 DIAGNOSIS — Z888 Allergy status to other drugs, medicaments and biological substances status: Secondary | ICD-10-CM | POA: Diagnosis not present

## 2016-10-04 DIAGNOSIS — Z955 Presence of coronary angioplasty implant and graft: Secondary | ICD-10-CM

## 2016-10-04 DIAGNOSIS — I10 Essential (primary) hypertension: Secondary | ICD-10-CM | POA: Diagnosis present

## 2016-10-04 DIAGNOSIS — Z833 Family history of diabetes mellitus: Secondary | ICD-10-CM

## 2016-10-04 DIAGNOSIS — Z8249 Family history of ischemic heart disease and other diseases of the circulatory system: Secondary | ICD-10-CM

## 2016-10-04 DIAGNOSIS — M6281 Muscle weakness (generalized): Secondary | ICD-10-CM

## 2016-10-04 DIAGNOSIS — E785 Hyperlipidemia, unspecified: Secondary | ICD-10-CM | POA: Diagnosis present

## 2016-10-04 DIAGNOSIS — Z7982 Long term (current) use of aspirin: Secondary | ICD-10-CM

## 2016-10-04 DIAGNOSIS — E119 Type 2 diabetes mellitus without complications: Secondary | ICD-10-CM | POA: Diagnosis present

## 2016-10-04 DIAGNOSIS — Z8673 Personal history of transient ischemic attack (TIA), and cerebral infarction without residual deficits: Secondary | ICD-10-CM

## 2016-10-04 DIAGNOSIS — K219 Gastro-esophageal reflux disease without esophagitis: Secondary | ICD-10-CM | POA: Diagnosis present

## 2016-10-04 DIAGNOSIS — R531 Weakness: Secondary | ICD-10-CM | POA: Diagnosis not present

## 2016-10-04 DIAGNOSIS — Z66 Do not resuscitate: Secondary | ICD-10-CM | POA: Diagnosis present

## 2016-10-04 DIAGNOSIS — I2510 Atherosclerotic heart disease of native coronary artery without angina pectoris: Secondary | ICD-10-CM | POA: Diagnosis present

## 2016-10-04 DIAGNOSIS — Z7901 Long term (current) use of anticoagulants: Secondary | ICD-10-CM

## 2016-10-04 DIAGNOSIS — F0390 Unspecified dementia without behavioral disturbance: Secondary | ICD-10-CM | POA: Diagnosis present

## 2016-10-04 DIAGNOSIS — A419 Sepsis, unspecified organism: Principal | ICD-10-CM | POA: Diagnosis present

## 2016-10-04 DIAGNOSIS — R262 Difficulty in walking, not elsewhere classified: Secondary | ICD-10-CM

## 2016-10-04 DIAGNOSIS — G9341 Metabolic encephalopathy: Secondary | ICD-10-CM | POA: Diagnosis present

## 2016-10-04 DIAGNOSIS — I252 Old myocardial infarction: Secondary | ICD-10-CM

## 2016-10-04 DIAGNOSIS — Z794 Long term (current) use of insulin: Secondary | ICD-10-CM | POA: Diagnosis not present

## 2016-10-04 DIAGNOSIS — Z85828 Personal history of other malignant neoplasm of skin: Secondary | ICD-10-CM | POA: Diagnosis not present

## 2016-10-04 DIAGNOSIS — N179 Acute kidney failure, unspecified: Secondary | ICD-10-CM | POA: Diagnosis present

## 2016-10-04 DIAGNOSIS — J189 Pneumonia, unspecified organism: Secondary | ICD-10-CM | POA: Diagnosis present

## 2016-10-04 LAB — BASIC METABOLIC PANEL
Anion gap: 9 (ref 5–15)
BUN: 16 mg/dL (ref 6–20)
CO2: 28 mmol/L (ref 22–32)
CREATININE: 1.51 mg/dL — AB (ref 0.61–1.24)
Calcium: 8.7 mg/dL — ABNORMAL LOW (ref 8.9–10.3)
Chloride: 99 mmol/L — ABNORMAL LOW (ref 101–111)
GFR calc Af Amer: 47 mL/min — ABNORMAL LOW (ref >60)
GFR, EST NON AFRICAN AMERICAN: 41 mL/min — AB (ref >60)
GLUCOSE: 154 mg/dL — AB (ref 65–99)
POTASSIUM: 4.1 mmol/L (ref 3.5–5.1)
Sodium: 136 mmol/L (ref 135–145)

## 2016-10-04 LAB — CBC
HEMATOCRIT: 40 % (ref 40.0–52.0)
Hemoglobin: 14.3 g/dL (ref 13.0–18.0)
MCH: 31.7 pg (ref 26.0–34.0)
MCHC: 35.7 g/dL (ref 32.0–36.0)
MCV: 88.7 fL (ref 80.0–100.0)
PLATELETS: 267 10*3/uL (ref 150–440)
RBC: 4.51 MIL/uL (ref 4.40–5.90)
RDW: 14.6 % — ABNORMAL HIGH (ref 11.5–14.5)
WBC: 24 10*3/uL — ABNORMAL HIGH (ref 3.8–10.6)

## 2016-10-04 LAB — CREATININE, SERUM
CREATININE: 1.26 mg/dL — AB (ref 0.61–1.24)
GFR calc Af Amer: 59 mL/min — ABNORMAL LOW (ref >60)
GFR calc non Af Amer: 51 mL/min — ABNORMAL LOW (ref >60)

## 2016-10-04 LAB — URINALYSIS, COMPLETE (UACMP) WITH MICROSCOPIC
Bilirubin Urine: NEGATIVE
Glucose, UA: NEGATIVE mg/dL
Ketones, ur: NEGATIVE mg/dL
Leukocytes, UA: NEGATIVE
NITRITE: NEGATIVE
PROTEIN: 30 mg/dL — AB
Specific Gravity, Urine: 1.016 (ref 1.005–1.030)
pH: 5 (ref 5.0–8.0)

## 2016-10-04 LAB — GLUCOSE, CAPILLARY
Glucose-Capillary: 157 mg/dL — ABNORMAL HIGH (ref 65–99)
Glucose-Capillary: 157 mg/dL — ABNORMAL HIGH (ref 65–99)
Glucose-Capillary: 122 mg/dL — ABNORMAL HIGH (ref 65–99)

## 2016-10-04 LAB — INFLUENZA PANEL BY PCR (TYPE A & B)
INFLAPCR: NEGATIVE
INFLBPCR: NEGATIVE

## 2016-10-04 MED ORDER — GUAIFENESIN-DM 100-10 MG/5ML PO SYRP: 5.0000 mL | ORAL_SOLUTION | ORAL | Status: DC | PRN

## 2016-10-04 MED ORDER — INSULIN ASPART 100 UNIT/ML ~~LOC~~ SOLN
0.0000 [IU] | Freq: Three times a day (TID) | SUBCUTANEOUS | Status: DC
  Administered 2016-10-04 – 2016-10-05 (×2): 2 [IU] via SUBCUTANEOUS
  Administered 2016-10-05: 3 [IU] via SUBCUTANEOUS
  Administered 2016-10-06 (×2): 2 [IU] via SUBCUTANEOUS
  Administered 2016-10-06: 3 [IU] via SUBCUTANEOUS
  Administered 2016-10-07: 5 [IU] via SUBCUTANEOUS
  Filled 2016-10-04: qty 2
  Filled 2016-10-04: qty 5
  Filled 2016-10-04: qty 2
  Filled 2016-10-04: qty 3
  Filled 2016-10-04: qty 2

## 2016-10-04 MED ORDER — ACETAMINOPHEN 325 MG PO TABS
650.0000 mg | ORAL_TABLET | Freq: Four times a day (QID) | ORAL | Status: DC | PRN
  Administered 2016-10-07: 650 mg via ORAL
  Filled 2016-10-04: qty 2

## 2016-10-04 MED ORDER — LEVOFLOXACIN IN D5W 500 MG/100ML IV SOLN
500.0000 mg | INTRAVENOUS | Status: DC
  Administered 2016-10-05 – 2016-10-07 (×3): 500 mg via INTRAVENOUS
  Filled 2016-10-04 (×3): qty 100

## 2016-10-04 MED ORDER — ROSUVASTATIN CALCIUM 10 MG PO TABS
40.0000 mg | ORAL_TABLET | Freq: Every day | ORAL | Status: DC
Start: 2016-10-04 — End: 2016-10-07
  Administered 2016-10-04 – 2016-10-06 (×2): 40 mg via ORAL
  Filled 2016-10-04: qty 8
  Filled 2016-10-04: qty 2
  Filled 2016-10-04: qty 8
  Filled 2016-10-04: qty 2

## 2016-10-04 MED ORDER — LORATADINE 10 MG PO TABS
10.0000 mg | ORAL_TABLET | Freq: Every day | ORAL | Status: DC | PRN
  Administered 2016-10-06: 10 mg via ORAL
  Filled 2016-10-04 (×2): qty 1

## 2016-10-04 MED ORDER — SODIUM CHLORIDE 0.9 % IV SOLN
INTRAVENOUS | Status: DC
  Administered 2016-10-04: 16:00:00 via INTRAVENOUS
  Administered 2016-10-05: 75 mL/h via INTRAVENOUS

## 2016-10-04 MED ORDER — CARVEDILOL 6.25 MG PO TABS
6.2500 mg | ORAL_TABLET | Freq: Two times a day (BID) | ORAL | Status: DC
  Administered 2016-10-04 – 2016-10-07 (×5): 6.25 mg via ORAL
  Filled 2016-10-04 (×6): qty 1

## 2016-10-04 MED ORDER — DILTIAZEM HCL ER COATED BEADS 120 MG PO CP24
120.0000 mg | ORAL_CAPSULE | Freq: Every day | ORAL | Status: DC
  Administered 2016-10-04 – 2016-10-07 (×4): 120 mg via ORAL
  Filled 2016-10-04 (×4): qty 1

## 2016-10-04 MED ORDER — INSULIN ASPART 100 UNIT/ML ~~LOC~~ SOLN
0.0000 [IU] | Freq: Every day | SUBCUTANEOUS | Status: DC
Start: 2016-10-04 — End: 2016-10-07

## 2016-10-04 MED ORDER — DIGOXIN 250 MCG PO TABS
0.2500 mg | ORAL_TABLET | Freq: Every day | ORAL | Status: DC
  Administered 2016-10-04 – 2016-10-07 (×4): 0.25 mg via ORAL
  Filled 2016-10-04 (×4): qty 1

## 2016-10-04 MED ORDER — RIVAROXABAN 15 MG PO TABS
15.0000 mg | ORAL_TABLET | Freq: Every day | ORAL | Status: DC
  Administered 2016-10-04 – 2016-10-05 (×2): 15 mg via ORAL
  Filled 2016-10-04 (×2): qty 1

## 2016-10-04 MED ORDER — LEVOFLOXACIN IN D5W 750 MG/150ML IV SOLN
750.0000 mg | Freq: Once | INTRAVENOUS | Status: AC
  Administered 2016-10-04: 750 mg via INTRAVENOUS
  Filled 2016-10-04: qty 150

## 2016-10-04 MED ORDER — INSULIN GLARGINE 100 UNIT/ML ~~LOC~~ SOLN
40.0000 [IU] | Freq: Every day | SUBCUTANEOUS | Status: DC
  Administered 2016-10-04 – 2016-10-07 (×4): 40 [IU] via SUBCUTANEOUS
  Filled 2016-10-04 (×4): qty 0.4

## 2016-10-04 MED ORDER — ASPIRIN 81 MG PO CHEW
81.0000 mg | CHEWABLE_TABLET | Freq: Every day | ORAL | Status: DC
  Administered 2016-10-04 – 2016-10-07 (×4): 81 mg via ORAL
  Filled 2016-10-04 (×4): qty 1

## 2016-10-04 NOTE — ED Notes (Addendum)
Daughter at bedside. Daughter states pt was fine yesterday and this am he has fallen twice and is much weaker than usual. Daughter also states that his speech is a little more slurred than normal. Pt with equal hand grips but noted to be weak. NAD. edp aware. Continue to monitor.

## 2016-10-04 NOTE — ED Notes (Signed)
Attempted to call report. Nurse unavailable at this time.

## 2016-10-04 NOTE — ED Notes (Signed)
Patient transported to X-ray 

## 2016-10-04 NOTE — ED Provider Notes (Signed)
[facility] Emergency Department Provider Note    ____________________________________________   I have reviewed the triage vital signs and the nursing notes.   HISTORY  Chief Complaint Fatigue   History limited by: Speech difficulty, some history obtained from daughter   HPI Robert Rojas is a 83 y.o. male who presents to the emergency department today because of concerns for weakness. Daughter states that he lives with her. She noticed some weakness yesterday. This morning he was again week. She thought he might of been somewhat more weak on the right side however the patient feels it was more on the left side. Patient does have a history of strokes. Daughter states that baseline patient has problems with his speech however might be a little worse today. They have not noticed any change in his urination. Patient denies any nausea or vomiting.   Past Medical History:  Diagnosis Date  . Arthralgia    left knee/patella/tibia/fibula  . BCC (basal cell carcinoma)   . Coronary artery disease   . CVA (cerebral infarction)    left sided  . Erectile dysfunction   . Essential hypertension   . GERD (gastroesophageal reflux disease)   . Hyperlipemia   . MI (myocardial infarction) 01/2011  . Prostatitis   . Spinal stenosis   . TIA (transient ischemic attack)   . Type 2 diabetes mellitus [facility])     Patient Active Problem List   Diagnosis Date Noted  . Syncope 07/28/2015  . Erectile dysfunction of organic origin 05/05/2015    Past Surgical History:  Procedure Laterality Date  . CORONARY ANGIOPLASTY WITH STENT PLACEMENT  2007  . KNEE SURGERY Left   . LUMBAR LAMINECTOMY      Prior to Admission medications   Medication Sig Start Date End Date Taking? Authorizing Provider  aspirin 81 MG tablet Take 81 mg by mouth daily.    Historical Provider, MD  carvedilol (COREG) 6.25 MG tablet Take 6.25 mg by mouth 2 (two) times daily with a meal.  01/27/14   Historical  Provider, MD  cephALEXin (KEFLEX) 500 MG capsule Take 1 capsule (500 mg total) by mouth 3 (three) times daily. Patient not taking: Reported on 12/02/2015 07/31/15   Max Sane, MD  cetirizine (ZYRTEC) 10 MG tablet TAKE 1 TABLET BY MOUTH AT BEDTIME FOR NASAL DRIP 03/31/15   Historical Provider, MD  dabigatran (PRADAXA) 150 MG CAPS capsule Take 150 mg by mouth 2 (two) times daily.    Historical Provider, MD  digoxin (LANOXIN) 0.25 MG tablet Take 0.25 mg by mouth daily.    Historical Provider, MD  diltiazem (CARDIZEM CD) 240 MG 24 hr capsule Take 120 mg by mouth daily.  12/30/13   Historical Provider, MD  furosemide (LASIX) 40 MG tablet Take 40 mg by mouth daily.    Historical Provider, MD  HUMALOG KWIKPEN 100 UNIT/ML KiwkPen USE AS DIRECTED FOR SLIDING SCALE FOUR TIMES DAILY. MAX OF 48 UNITS PER DAY 09/11/15   Historical Provider, MD  JANUVIA 100 MG tablet Take 100 mg by mouth daily. 09/04/15   Historical Provider, MD  LANTUS SOLOSTAR 100 UNIT/ML Solostar Pen Inject 40 Units into the skin daily at 10 pm.  03/23/15   Historical Provider, MD  lisinopril (PRINIVIL,ZESTRIL) 2.5 MG tablet Take 2.5 mg by mouth daily.    Historical Provider, MD  potassium chloride SA (KLOR-CON M20) 20 MEQ tablet Take 20 mEq by mouth daily.    Historical Provider, MD  pravastatin (PRAVACHOL) 80 MG tablet Take 80  mg by mouth every evening. 09/04/15   Historical Provider, MD  XARELTO 20 MG TABS tablet Take 1 tablet by mouth daily. 11/18/15   Historical Provider, MD    Allergies Lipitor [atorvastatin] and Lisinopril  Family History  Problem Relation Age of Onset  . Kidney disease Neg Hx   . Prostate cancer Neg Hx     Social History Social History  Substance Use Topics  . Smoking status: Never Smoker  . Smokeless tobacco: Never Used  . Alcohol use No    Review of Systems  Constitutional: Negative for fever. Positive for weakness.  Cardiovascular: Positive for chest tightness.  Respiratory: Negative for shortness of  breath. Gastrointestinal: Negative for abdominal pain, vomiting and diarrhea. Neurological: Negative for headaches, focal weakness or numbness.  10-point ROS otherwise negative.  ____________________________________________   PHYSICAL EXAM:  VITAL SIGNS: ED Triage Vitals [10/04/16 0949]  Enc Vitals Group     BP (!) 130/96     Pulse Rate 79     Resp 16     Temp      Temp src      SpO2 100 %     Weight 195 lb (88.5 kg)     Height 5\' 10"  (1.778 m)     Head Circumference     Constitutional: Alert and oriented. Well appearing and in no distress. Eyes: Conjunctivae are normal. Normal extraocular movements. ENT   Head: Normocephalic and atraumatic.   Nose: No congestion/rhinnorhea.   Mouth/Throat: Mucous membranes are moist.   Neck: No stridor. Hematological/Lymphatic/Immunilogical: No cervical lymphadenopathy. Cardiovascular: Normal rate, regular rhythm.  No murmurs, rubs, or gallops. Respiratory: Normal respiratory effort without tachypnea nor retractions. Breath sounds are clear and equal bilaterally. No wheezes/rales/rhonchi. Gastrointestinal: Soft and non tender. No rebound. No guarding.  Genitourinary: Deferred Musculoskeletal: Normal range of motion in all extremities. No lower extremity edema. Neurologic:  Normal speech and language. No gross focal neurologic deficits are appreciated.  Skin:  Skin is warm, dry and intact. No rash noted. Psychiatric: Mood and affect are normal. Speech and behavior are normal. Patient exhibits appropriate insight and judgment.  ____________________________________________    LABS (pertinent positives/negatives)  Labs Reviewed  BASIC METABOLIC PANEL - Abnormal; Notable for the following:       Result Value   Chloride 99 (*)    Glucose, Bld 154 (*)    Creatinine, Ser 1.51 (*)    Calcium 8.7 (*)    GFR calc non Af Amer 41 (*)    GFR calc Af Amer 47 (*)    All other components within normal limits  CBC - Abnormal;  Notable for the following:    WBC 24.0 (*)    RDW 14.6 (*)    All other components within normal limits  URINALYSIS, COMPLETE (UACMP) WITH MICROSCOPIC - Abnormal; Notable for the following:    Color, Urine YELLOW (*)    APPearance HAZY (*)    Hgb urine dipstick SMALL (*)    Protein, ur 30 (*)    Bacteria, UA RARE (*)    Squamous Epithelial / LPF 0-5 (*)    All other components within normal limits  CULTURE, BLOOD (ROUTINE X 2)  CULTURE, BLOOD (ROUTINE X 2)  INFLUENZA PANEL BY PCR (TYPE A & B)  CBG MONITORING, ED     ____________________________________________   EKG  None  ____________________________________________    RADIOLOGY  CXR IMPRESSION:  Patchy airspace opacities right greater than left worrisome for  bronchopneumonia. Followup PA and lateral chest  X-ray is recommended  in 3-4 weeks following trial of antibiotic therapy to ensure  resolution and exclude underlying malignancy.      ____________________________________________   PROCEDURES  Procedures  ____________________________________________   INITIAL IMPRESSION / ASSESSMENT AND PLAN / ED COURSE  Pertinent labs & imaging results that were available during my care of the patient were reviewed by me and considered in my medical decision making (see chart for details).  Patient presented to the emergency department today because of weakness noted at home. Workup here is concerning for leukocytosis and likely pneumonia on chest x-ray. I do think infection is likely causing the patient's weakness. Will plan on starting IV antibiotics and admitted to the hospital service.  ____________________________________________   FINAL CLINICAL IMPRESSION(S) / ED DIAGNOSES  Final diagnoses:  Weakness  Community acquired pneumonia, unspecified laterality     Note: This dictation was prepared with Sales executive. Any transcriptional errors that result from this process are unintentional     Nance Pear, MD 10/04/16 1243

## 2016-10-04 NOTE — H&P (Signed)
[facility] NAME: Robert Rojas    MR#:  [id number]  DATE OF BIRTH:  April 09, 1930  DATE OF ADMISSION:  10/04/2016  PRIMARY CARE PHYSICIAN: Perrin Maltese, MD   REQUESTING/REFERRING PHYSICIAN: Nance Pear, MD  CHIEF COMPLAINT:   Chief Complaint  Patient presents with  . Fatigue   Weakness since yesterday HISTORY OF PRESENT ILLNESS:  Robert Rojas  is a 82 y.o. male with a known history of Hypertension, diabetes, CVA, hyperlipidemia and CAD. The patient was sent from home to the ED due to weakness. The patient is poor historian. He was noticed weakness by his daughter yesterday. The daughter salt some more weakness on the right side, but the patient. It was more on left side. The patient was found to have leukocytosis 24,000. Chest x-ray E ED showed right-sided pneumonia.  PAST MEDICAL HISTORY:   Past Medical History:  Diagnosis Date  . Arthralgia    left knee/patella/tibia/fibula  . BCC (basal cell carcinoma)   . Coronary artery disease   . CVA (cerebral infarction)    left sided  . Erectile dysfunction   . Essential hypertension   . GERD (gastroesophageal reflux disease)   . Hyperlipemia   . MI (myocardial infarction) 01/2011  . Prostatitis   . Spinal stenosis   . TIA (transient ischemic attack)   . Type 2 diabetes mellitus ([place])     PAST SURGICAL HISTORY:   Past Surgical History:  Procedure Laterality Date  . CORONARY ANGIOPLASTY WITH STENT PLACEMENT  2007  . KNEE SURGERY Left   . LUMBAR LAMINECTOMY      SOCIAL HISTORY:   Social History  Substance Use Topics  . Smoking status: Never Smoker  . Smokeless tobacco: Never Used  . Alcohol use No    FAMILY HISTORY:   Family History  Problem Relation Age of Onset  . Diabetes Mother   . Heart disease Mother   . Heart disease Father   . Kidney disease Neg Hx   . Prostate cancer Neg Hx     DRUG ALLERGIES:   Allergies  Allergen Reactions  . Lipitor  [Atorvastatin]   . Lisinopril     REVIEW OF SYSTEMS:   Review of Systems  Unable to perform ROS: Mental status change    MEDICATIONS AT HOME:   Prior to Admission medications   Medication Sig Start Date End Date Taking? Authorizing Provider  aspirin 81 MG tablet Take 81 mg by mouth daily.   Yes Historical Provider, MD  carvedilol (COREG) 6.25 MG tablet Take 6.25 mg by mouth 2 (two) times daily with a meal.  01/27/14  Yes Historical Provider, MD  cetirizine (ZYRTEC) 10 MG tablet TAKE 1 TABLET BY MOUTH AT BEDTIME FOR NASAL DRIP 03/31/15  Yes Historical Provider, MD  digoxin (LANOXIN) 0.25 MG tablet Take 0.25 mg by mouth daily.   Yes Historical Provider, MD  diltiazem (CARDIZEM CD) 120 MG 24 hr capsule Take 120 mg by mouth daily.  12/30/13  Yes Historical Provider, MD  furosemide (LASIX) 40 MG tablet Take 40 mg by mouth daily.   Yes Historical Provider, MD  HUMALOG KWIKPEN 100 UNIT/ML KiwkPen USE AS DIRECTED FOR SLIDING SCALE FOUR TIMES DAILY. MAX OF 48 UNITS PER DAY 09/11/15  Yes Historical Provider, MD  JANUVIA 100 MG tablet Take 100 mg by mouth daily. 09/04/15  Yes Historical Provider, MD  LANTUS SOLOSTAR 100 UNIT/ML Solostar Pen Inject 40 Units into the skin daily.  03/23/15  Yes Historical Provider, MD  lisinopril (PRINIVIL,ZESTRIL) 2.5 MG tablet Take 2.5 mg by mouth daily.   Yes Historical Provider, MD  potassium chloride SA (KLOR-CON M20) 20 MEQ tablet Take 20 mEq by mouth daily.   Yes Historical Provider, MD  rosuvastatin (CRESTOR) 40 MG tablet Take 40 mg by mouth at bedtime.   Yes Historical Provider, MD  XARELTO 15 MG TABS tablet Take 1 tablet by mouth daily. 11/18/15  Yes Historical Provider, MD      VITAL SIGNS:  Blood pressure (!) 137/50, pulse 73, temperature 97.5 F (36.4 C), temperature source Oral, resp. rate (!) 21, height 5\' 10"  (1.778 m), weight 195 lb (88.5 kg), SpO2 97 %.  PHYSICAL EXAMINATION:  Physical Exam  GENERAL:  83 y.o.-year-old patient lying in the bed with  no acute distress.  EYES: Pupils equal, round, reactive to light and accommodation. No scleral icterus. Extraocular muscles intact.  HEENT: Head atraumatic, normocephalic. Oropharynx and nasopharynx clear. Dry oral mucosa. NECK:  Supple, no jugular venous distention. No thyroid enlargement, no tenderness.  LUNGS: Normal breath sounds bilaterally, no wheezing, crackles on the right side. No use of accessory muscles of respiration.  CARDIOVASCULAR: S1, S2 normal. No murmurs, rubs, or gallops.  ABDOMEN: Soft, nontender, nondistended. Bowel sounds present. No organomegaly or mass.  EXTREMITIES: No pedal edema, cyanosis, or clubbing.  NEUROLOGIC: Cranial nerves II through XII are intact. Muscle strength 4/5 in all extremities. Sensation intact. Gait not checked.  PSYCHIATRIC: The patient is alert and oriented x 2.  SKIN: No obvious rash, lesion, or ulcer.   LABORATORY PANEL:   CBC  Recent Labs Lab 10/04/16 0953  WBC 24.0*  HGB 14.3  HCT 40.0  PLT 267   ------------------------------------------------------------------------------------------------------------------  Chemistries   Recent Labs Lab 10/04/16 0953  NA 136  K 4.1  CL 99*  CO2 28  GLUCOSE 154*  BUN 16  CREATININE 1.51*  CALCIUM 8.7*   ------------------------------------------------------------------------------------------------------------------  Cardiac Enzymes No results for input(s): TROPONINI in the last 168 hours. ------------------------------------------------------------------------------------------------------------------  RADIOLOGY:  Dg Chest 2 View  Result Date: 10/04/2016 CLINICAL DATA:  chest pain, weakness EXAM: CHEST  2 VIEW COMPARISON:  07/27/2015 FINDINGS: Patchy airspace opacities have developed throughout the right lung. Minimal patchy densities at the left base. Normal heart size. No pneumothorax. No pleural effusion. Stable mid-level thoracic compression deformity. IMPRESSION: Patchy  airspace opacities right greater than left worrisome for bronchopneumonia. Followup PA and lateral chest X-ray is recommended in 3-4 weeks following trial of antibiotic therapy to ensure resolution and exclude underlying malignancy. Electronically Signed   By: Marybelle Killings M.D.   On: 10/04/2016 12:03      IMPRESSION AND PLAN:   Sepsis due to pneumonia, CAP. The patient will be admitted to medical floor. Continue antibiotics, follow-up CBC and cultures. NEB when necessary.  Acute renal failure. Hold Lasix and lisinopril, give IV fluid support and follow-up BMP.  Acute metabolic encephalopathy. Due to above. Aspiration and fall precaution.  Diabetes. Start a sliding scale and continue home Lantus. History of CVA. Continue aspirin, xarelto and statin.   All the records are reviewed and case discussed with ED provider. Management plans discussed with the patient, family and they are in agreement.  CODE STATUS: DO NOT RESUSCITATE.  TOTAL TIME TAKING CARE OF THIS PATIENT: 55 minutes.    Demetrios Loll M.D on 10/04/2016 at 2:57 PM  Between 7am to 6pm - Pager - [phone]  After 6pm go to [email or web address] - [facility]  [facility]  Office  [phone]  CC: Primary care physician; Perrin Maltese, MD   Note: This dictation was prepared with Dragon dictation along with smaller phrase technology. Any transcriptional errors that result from this process are unintentional.

## 2016-10-04 NOTE — ED Notes (Signed)
Hooked pt up to monitor.

## 2016-10-04 NOTE — ED Notes (Signed)
Patient denies pain and is resting comfortably.  

## 2016-10-04 NOTE — ED Notes (Signed)
Dr. Chen at bedside.

## 2016-10-04 NOTE — ED Triage Notes (Signed)
Pt to ED from home with reports of weakness starting yesterday. Pt with hx of 2 strokes in the past but was negative for stroke screen per ems. Ems reports foul urine smell. All vss per ems.

## 2016-10-05 LAB — CBC
HCT: 36 % — ABNORMAL LOW (ref 40.0–52.0)
Hemoglobin: 12.5 g/dL — ABNORMAL LOW (ref 13.0–18.0)
MCH: 30.7 pg (ref 26.0–34.0)
MCHC: 34.9 g/dL (ref 32.0–36.0)
MCV: 88 fL (ref 80.0–100.0)
Platelets: 206 10*3/uL (ref 150–440)
RBC: 4.09 MIL/uL — AB (ref 4.40–5.90)
RDW: 14.4 % (ref 11.5–14.5)
WBC: 15.4 10*3/uL — ABNORMAL HIGH (ref 3.8–10.6)

## 2016-10-05 LAB — BASIC METABOLIC PANEL
Anion gap: 6 (ref 5–15)
BUN: 16 mg/dL (ref 6–20)
CO2: 26 mmol/L (ref 22–32)
Calcium: 8.1 mg/dL — ABNORMAL LOW (ref 8.9–10.3)
Chloride: 105 mmol/L (ref 101–111)
Creatinine, Ser: 1.02 mg/dL (ref 0.61–1.24)
GFR calc Af Amer: >60 mL/min (ref >60)
GFR calc non Af Amer: >60 mL/min (ref >60)
Glucose, Bld: 77 mg/dL (ref 65–99)
Potassium: 3.4 mmol/L — ABNORMAL LOW (ref 3.5–5.1)
SODIUM: 137 mmol/L (ref 135–145)

## 2016-10-05 LAB — GLUCOSE, CAPILLARY
GLUCOSE-CAPILLARY: 180 mg/dL — AB (ref 65–99)
GLUCOSE-CAPILLARY: 239 mg/dL — AB (ref 65–99)
Glucose-Capillary: 89 mg/dL (ref 65–99)
Glucose-Capillary: 178 mg/dL — ABNORMAL HIGH (ref 65–99)

## 2016-10-05 LAB — STREP PNEUMONIAE URINARY ANTIGEN: Strep Pneumo Urinary Antigen: NEGATIVE

## 2016-10-05 LAB — HIV ANTIBODY (ROUTINE TESTING W REFLEX): HIV SCREEN 4TH GENERATION: NONREACTIVE

## 2016-10-05 MED ORDER — ENSURE ENLIVE PO LIQD
237.0000 mL | Freq: Two times a day (BID) | ORAL | Status: DC
  Administered 2016-10-05 – 2016-10-07 (×5): 237 mL via ORAL

## 2016-10-05 MED ORDER — RIVAROXABAN 20 MG PO TABS
20.0000 mg | ORAL_TABLET | Freq: Every day | ORAL | Status: DC
  Administered 2016-10-06: 20 mg via ORAL
  Filled 2016-10-05: qty 1

## 2016-10-05 NOTE — Progress Notes (Signed)
Pt continues to have intermittent confusion thinking his wife/daughter are in the room with him or he is talking to them on the phone. He has already pulled out both of his PIVs. Daughter has been called and updated as well as to try and reorient him to situation as she is not able to come to the hospital. MD Posey Pronto notified and given order to initiate telesitter. He has been disconnected from all lines/attachments. A new PIV was placed and wrapped with gauze to prevent his from pulling it out. No other orders received at this time.

## 2016-10-05 NOTE — Progress Notes (Addendum)
[place] [facility] [place] NAME: Robert Rojas    MR#:  [id number]  DATE OF BIRTH:  [birth year]  SUBJECTIVE:  Came in with confusion and sob with weakness Hallucination about wife and dter talking in the room  REVIEW OF SYSTEMS:   Review of Systems  Unable to perform ROS: Medical condition   Tolerating Diet:yes Tolerating PT: pending  DRUG ALLERGIES:   Allergies  Allergen Reactions  . Lipitor [Atorvastatin]   . Lisinopril     VITALS:  Blood pressure (!) 155/62, pulse 78, temperature 98.9 F (37.2 C), temperature source Oral, resp. rate 18, height 5\' 10"  (1.778 m), weight 88.5 kg (195 lb), SpO2 96 %.  PHYSICAL EXAMINATION:   Physical Exam  GENERAL:  83 y.o.-year-old patient lying in the bed with no acute distress.  EYES: Pupils equal, round, reactive to light and accommodation. No scleral icterus. Extraocular muscles intact.  HEENT: Head atraumatic, normocephalic. Oropharynx and nasopharynx clear.  NECK:  Supple, no jugular venous distention. No thyroid enlargement, no tenderness.  LUNGS: Normal breath sounds bilaterally, no wheezing, rales, rhonchi. No use of accessory muscles of respiration.  CARDIOVASCULAR: S1, S2 normal. No murmurs, rubs, or gallops.  ABDOMEN: Soft, nontender, nondistended. Bowel sounds present. No organomegaly or mass.  EXTREMITIES: No cyanosis, clubbing or edema b/l.    NEUROLOGIC: Cranial nerves II through XII are intact. No focal Motor or sensory deficits b/l.   PSYCHIATRIC:  patient is alert and confused SKIN: No obvious rash, lesion, or ulcer.   LABORATORY PANEL:  CBC  Recent Labs Lab 10/05/16 0401  WBC 15.4*  HGB 12.5*  HCT 36.0*  PLT 206    Chemistries   Recent Labs Lab 10/05/16 0401  NA 137  K 3.4*  CL 105  CO2 26  GLUCOSE 77  BUN 16  CREATININE 1.02  CALCIUM 8.1*   Cardiac Enzymes No results for input(s): TROPONINI in the last 168 hours. RADIOLOGY:  Dg Chest 2  View  Result Date: 10/04/2016 CLINICAL DATA:  chest pain, weakness EXAM: CHEST  2 VIEW COMPARISON:  07/27/2015 FINDINGS: Patchy airspace opacities have developed throughout the right lung. Minimal patchy densities at the left base. Normal heart size. No pneumothorax. No pleural effusion. Stable mid-level thoracic compression deformity. IMPRESSION: Patchy airspace opacities right greater than left worrisome for bronchopneumonia. Followup PA and lateral chest X-ray is recommended in 3-4 weeks following trial of antibiotic therapy to ensure resolution and exclude underlying malignancy. Electronically Signed   By: Marybelle Killings M.D.   On: 10/04/2016 12:03   ASSESSMENT AND PLAN:  Robert Rojas  is a 83 y.o. male with a known history of Hypertension, diabetes, CVA, hyperlipidemia and CAD. The patient was sent from home to the ED due to weakness. The patient is poor historian. He was noticed weakness by his daughter yesterday.   * Sepsis due to pneumonia, CAP/right lung Continue antibiotics, follow-up CBC and cultures. NEB when necessary.  *Acute renal failure. Hold Lasix and lisinopril, give IV fluid support and follow-up BMP. -creat [id number]  *Acute metabolic encephalopathy. Due to above. -?dementia underlying  *Diabetes. Start a sliding scale and continue home Lantus.  *History of CVA. Continue aspirin, xarelto and statin.  * PT to see   CM/CSW for d/c planning  Case discussed with Care Management/Social Worker. Management plans discussed with the patient, family and they are in agreement.  CODE STATUS: DNR  DVT Prophylaxis: xarelto  TOTAL TIME TAKING CARE OF THIS PATIENT: 30minutes.  >  50% time spent on counselling and coordination of care  POSSIBLE D/C IN 1-2DAYS, DEPENDING ON CLINICAL CONDITION.  Note: This dictation was prepared with Dragon dictation along with smaller phrase technology. Any transcriptional errors that result from this process are unintentional.  Sabine Tenenbaum M.D  on 10/05/2016 at 5:03 PM  Between 7am to 6pm - Pager - [phone]  After 6pm go to [email or web address] - password [email or web address] [facility]  Office  [phone]  CC: Primary care physician; Perrin Maltese, MD

## 2016-10-05 NOTE — Care Management Note (Signed)
Case Management Note  Patient Details  Name: Robert Rojas MRN: [id number] Date of Birth: May 21, 1930  Subjective/Objective:                  Attempted to meet with patient to discuss discharge planning. He is only oriented to place which per daughter [name] is not his baseline. Per [name] patient lives at home with his wife. They depend on her for transportation which is limited due to a small child she cares for- impacting her ability to visit/moms ability to visit patient in hospital. Patient "has had several strokes" but has been able to ambulate with a 4-wheeled walker at home. He sees NP with Dr. Perrin Maltese office. [facility] occassions. I explained that [facility] is not currently accepting patient's [place] list provided- [facility] requested by daughter. He uses [facility] for Medication and she denies that he has problems obtaining. Daughter requested Radiation protection practitioner for SNF if needed; CSW updated. PT evaluation pending.   Action/Plan: List of home health agencies provided to daughter- referral to [facility]. RNCM will continue to follow.   Expected Discharge Date:                  Expected Discharge Plan:     In-House Referral:  Clinical Social Work  Discharge planning Services  CM Consult  Post Acute Care Choice:  Home Health Choice offered to:  Patient, Adult Children  DME Arranged:    DME Agency:     HH Arranged:    [place] Agency:     Status of Service:  In process, will continue to follow  If discussed at Long Length of Stay Meetings, dates discussed:    Additional Comments:  Marshell Garfinkel, RN 10/05/2016, 9:38 AM

## 2016-10-05 NOTE — Evaluation (Signed)
Physical Therapy Evaluation Patient Details Name: Robert Rojas MRN: [id number] DOB: 1930/03/17 Today's Date: 10/05/2016   History of Present Illness  Pt is an 83 y.o. male presenting to hospital with fatigue and weakness.  Pt admitted with sepsis d/t R sided community acquired PNA, acute renal failure, and acute metabolic encephalopathy.  PMH includes baseline speech difficulties, h/o strokes, MI, htn, and DM.  Clinical Impression  Prior to hospital admission, pt reports living with his daughter and wife.  Pt appearing to be a poor historian during session (and also difficult to understand pt at times) and unsure of accuracy of pt's statements regarding PLOF/home set-up.  Currently pt is min to mod assist with bed mobility, transfers, and ambulation 70 feet with RW.  Pt overall unsteady and unsafe with gait requiring assist for safety and to keep walker closer to pt and also for pt to stay within RW during turns.  Pt would benefit from skilled PT to address noted impairments and functional limitations.  Currently recommend pt discharge to STR to improve balance and decrease assist with functional mobility.    Follow Up Recommendations SNF    Equipment Recommendations  Rolling walker with 5" wheels    Recommendations for Other Services       Precautions / Restrictions Precautions Precautions: Fall Precaution Comments: Elevated HOB Restrictions Weight Bearing Restrictions: No      Mobility  Bed Mobility Overal bed mobility: Needs Assistance Bed Mobility: Supine to Sit;Sit to Supine     Supine to sit: Mod assist;HOB elevated Sit to supine: Min assist   General bed mobility comments: assist for trunk supine to sit; assist for LE's sit to supine  Transfers Overall transfer level: Needs assistance Equipment used: Rolling walker (2 wheeled) Transfers: Sit to/from Stand Sit to Stand: Min assist;Mod assist         General transfer comment: 1st attempt standing pt unable to stand  on own d/t posterior lean; 2nd attempt pt requiring vc's for shifting weight forward and assist to stand d/t posterior lean  Ambulation/Gait Ambulation/Gait assistance: Min assist;Mod assist Ambulation Distance (Feet): 70 Feet Assistive device: Rolling walker (2 wheeled)   Gait velocity: decreased   General Gait Details: decreased B step length/foot clearance/heelstrike; B LE's externally rotated; pt pushing RW too far forward and required vc's and assist to keep RW closer to pt and to also keep pt within RW during turns; vc's required for safety  Stairs            Wheelchair Mobility    Modified Rankin (Stroke Patients Only)       Balance Overall balance assessment: Needs assistance Sitting-balance support: Bilateral upper extremity supported;Feet supported Sitting balance-Leahy Scale: Fair     Standing balance support: Bilateral upper extremity supported (on RW) Standing balance-Leahy Scale: Poor Standing balance comment: posterior lean initially with standing                             Pertinent Vitals/Pain Pain Assessment: No/denies pain  Vitals (HR and O2 on room air) stable and WFL throughout treatment session.    Home Living Family/patient expects to be discharged to:: Private residence Living Arrangements: Spouse/significant other;Children (Pt's daughter and pt's wife) Available Help at Discharge: Family           Home Equipment: Gilford Rile - 4 wheels Additional Comments: Pt reporting living in 2 story home with 10 steps to enter with R railing (unsure of accuracy of pt's  statements).    Prior Function Level of Independence: Needs assistance   Gait / Transfers Assistance Needed: Pt reports ambulating household distances with rollator.           Hand Dominance        Extremity/Trunk Assessment   Upper Extremity Assessment Upper Extremity Assessment: Generalized weakness    Lower Extremity Assessment Lower Extremity Assessment:  Generalized weakness       Communication   Communication:  (Difficulty understanding pt's speech intermittently)  Cognition Arousal/Alertness: Awake/alert Behavior During Therapy: WFL for tasks assessed/performed Overall Cognitive Status:  (Oriented to person but not place/time/situation)                 General Comments: Pt perseverating on nursing plans to put in a new IV (nursing verifying this).  Nursing cleared pt for participation in physical therapy.  Pt agreeable to PT session.    General Comments  Gait training    Exercises     Assessment/Plan    PT Assessment Patient needs continued PT services  PT Problem List Decreased strength;Decreased activity tolerance;Decreased balance;Decreased mobility;Decreased knowledge of use of DME          PT Treatment Interventions DME instruction;Gait training;Stair training;Functional mobility training;Therapeutic activities;Therapeutic exercise;Balance training;Patient/family education    PT Goals (Current goals can be found in the Care Plan section)  Acute Rehab PT Goals Patient Stated Goal: to go home PT Goal Formulation: With patient Time For Goal Achievement: 10/19/16 Potential to Achieve Goals: Fair    Frequency Min 2X/week   Barriers to discharge Decreased caregiver support      Co-evaluation               End of Session Equipment Utilized During Treatment: Gait belt Activity Tolerance: Patient tolerated treatment well Patient left: in bed;with call bell/phone within reach;with bed alarm set Nurse Communication: Mobility status;Precautions         Time: [id number] PT Time Calculation (min) (ACUTE ONLY): 18 min   Charges:   PT Evaluation $PT Eval Low Complexity: 1 Procedure PT Treatments $Gait Training: 8-22 mins   PT G CodesLeitha Bleak 31-Oct-2016, 5:16 PM Leitha Bleak, [place]

## 2016-10-05 NOTE — Progress Notes (Signed)
Initial Nutrition Assessment  DOCUMENTATION CODES:   Not applicable  INTERVENTION:  1. Ensure Enlive po BID, each supplement provides 350 kcal and 20 grams of protein  NUTRITION DIAGNOSIS:   Predicted suboptimal nutrient intake related to lethargy/confusion as evidenced by other (see comment) (Per observation).  GOAL:   Patient will meet greater than or equal to 90% of their needs  MONITOR:   PO intake, I & O's, Labs, Weight trends  REASON FOR ASSESSMENT:   Malnutrition Screening Tool    ASSESSMENT:   Robert Rojas  is a 83 y.o. male with a known history of Hypertension, diabetes, CVA, hyperlipidemia and CAD. The patient was sent from home to the ED due to weakness. The patient is poor historian.   Spoke with [name] at bedside. He is a poor historian, answers appropriately to some questions but states "I have dementia" during my interview. He reports 10# wt loss recently - but per chart weight unchanged. However, weight in chart appears stated. PO intake thus far has been 100% Patient unable to provide diet history or PO intake PTA. Nutrition focused physical exam is WNL - patient appears very well nourished for someone his age, likely does not have problems with PO intake.  Labs and medications reviewed: K 3.4  Diet Order:  Diet heart healthy/carb modified Room service appropriate? Yes; Fluid consistency: Thin  Skin:  Reviewed, no issues  Last BM:  PTA  Height:   Ht Readings from Last 1 Encounters:  10/04/16 5\' 10"  (1.778 m)    Weight:   Wt Readings from Last 1 Encounters:  10/04/16 195 lb (88.5 kg)    Ideal Body Weight:  75.45 kg  BMI:  Body mass index is 27.98 kg/m.  Estimated Nutritional Needs:   Kcal:  1600-1900 calories  Protein:  87-106 gm  Fluid:  >/= 1.6L  EDUCATION NEEDS:   No education needs identified at this time  Satira Anis. Gaspar Fowle, MS, RD LDN Inpatient Clinical Dietitian Pager [phone]

## 2016-10-06 LAB — GLUCOSE, CAPILLARY
GLUCOSE-CAPILLARY: 223 mg/dL — AB (ref 65–99)
GLUCOSE-CAPILLARY: 188 mg/dL — AB (ref 65–99)
Glucose-Capillary: 173 mg/dL — ABNORMAL HIGH (ref 65–99)
Glucose-Capillary: 109 mg/dL — ABNORMAL HIGH (ref 65–99)

## 2016-10-06 LAB — PROCALCITONIN: PROCALCITONIN: 0.2 ng/mL

## 2016-10-06 MED ORDER — LISINOPRIL 5 MG PO TABS
2.5000 mg | ORAL_TABLET | Freq: Every day | ORAL | Status: DC
  Administered 2016-10-06 – 2016-10-07 (×2): 2.5 mg via ORAL
  Filled 2016-10-06 (×2): qty 1

## 2016-10-06 NOTE — Progress Notes (Addendum)
Physical Therapy Treatment Patient Details Name: Robert Rojas MRN: [id number] DOB: [birth year] Today's Date: 10/06/2016    History of Present Illness Pt is an 83 y.o. male presenting to hospital with fatigue and weakness.  Pt admitted with sepsis d/t R sided community acquired PNA, acute renal failure, and acute metabolic encephalopathy.  PMH includes baseline speech difficulties, h/o strokes, MI, htn, and DM.    PT Comments    Pt initially sleeping, but easily awoken and agreeable to PT. Denies pain; reports shortness of breath and weakness. Pt requires inconsistent increased assist for up in bed this session with increased time to attain sit. Pt ambulates with poor posture and increasing weakness with feeling as if he were to pass out, requiring assist of 2 to sit pt in chair. Pt did not lose consciousness and tolerates stand pivot with Mod A of 2 chair to bed. Heart rate 69 beats per minute and O2 sats 99% upon return to bed. Pt left in care of nursing assistant for personal hygiene. Spoke with nursing regarding session. Continue PT as tolerated for progression of strength and improved functional mobility.   Follow Up Recommendations  SNF     Equipment Recommendations       Recommendations for Other Services       Precautions / Restrictions Precautions Precautions: Fall Precaution Comments: Elevated HOB Restrictions Weight Bearing Restrictions: No    Mobility  Bed Mobility Overal bed mobility: Needs Assistance Bed Mobility: Supine to Sit;Sit to Supine     Supine to sit: Max assist;HOB elevated;Mod assist Sit to supine: Max assist;+2 for physical assistance;HOB elevated   General bed mobility comments: Pt gives good effort, but inconsistently requiring more A at times. Heavy lean R  Transfers Overall transfer level: Needs assistance Equipment used: Rolling walker (2 wheeled) Transfers: Sit to/from [facility] Sit to Stand: Min assist;+2 physical  assistance;+2 safety/equipment Stand pivot transfers: Mod assist;+2 physical assistance       General transfer comment: Required increased assisted sit during ambulation due to increasing weakness and pt feeling as if he would pass out. No LOC  Ambulation/Gait Ambulation/Gait assistance: Min assist;Mod assist  15 ft Assistive device: Rolling walker (2 wheeled) Gait Pattern/deviations: Step-to pattern;Trunk flexed Gait velocity: slow Gait velocity interpretation: <1.8 ft/sec, indicative of risk for recurrent falls General Gait Details: Poor posture, effortful. Increasing weakness requiring assist of 2 to sit to a chair brought to the pt   Stairs            Wheelchair Mobility    Modified Rankin (Stroke Patients Only)       Balance Overall balance assessment: Needs assistance Sitting-balance support: Feet supported;Bilateral upper extremity supported Sitting balance-Leahy Scale: Poor (at times F+) Sitting balance - Comments: Heavy lean to the R; inconsistently retropulsive as well Postural control: Right lateral lean;Posterior lean Standing balance support: Bilateral upper extremity supported Standing balance-Leahy Scale: Poor                      Cognition Arousal/Alertness: Awake/alert (Inititially sleeping; easily awoken) Behavior During Therapy: WFL for tasks assessed/performed Overall Cognitive Status: Within Functional Limits for tasks assessed                      Exercises      General Comments        Pertinent Vitals/Pain Pain Assessment: No/denies pain    Home Living  Prior Function            PT Goals (current goals can now be found in the care plan section)      Frequency    Min 2X/week      PT Plan Current plan remains appropriate    Co-evaluation             End of Session Equipment Utilized During Treatment: Gait belt Activity Tolerance: Patient limited by fatigue;Other  (comment) (weakness) Patient left: in bed;with nursing/sitter in room     Time: 1455-1516 PT Time Calculation (min) (ACUTE ONLY): 21 min  Charges:  $Gait Training: 8-22 mins                    G Codes:      Larae Grooms, PTA 10/06/2016, 3:53 PM

## 2016-10-06 NOTE — NC FL2 (Signed)
[place] LEVEL OF CARE SCREENING TOOL     IDENTIFICATION  Patient Name: Robert Rojas Birthdate: 07-Aug-1930 Sex: male Admission Date (Current Location): 10/04/2016  [place] and [place] Number:  Engineering geologist and Address:  [facility], [address] [place], [place] [address]      Provider Number: [id number]  Attending Physician Name and Address:  Max Sane, MD  Relative Name and Phone Number:       Current Level of Care: Hospital Recommended Level of Care: [place] Prior Approval Number:    Date Approved/Denied:   PASRR Number:  ([id number])  Discharge Plan: SNF    Current Diagnoses: Patient Active Problem List   Diagnosis Date Noted  . Pneumonia 10/04/2016  . Syncope 07/28/2015  . Erectile dysfunction of organic origin 05/05/2015    Orientation RESPIRATION BLADDER Height & Weight     Self, Place  Normal Incontinent Weight: 195 lb (88.5 kg) Height:  5\' 10"  (177.8 cm)  BEHAVIORAL SYMPTOMS/MOOD NEUROLOGICAL BOWEL NUTRITION STATUS   (None. )  (None.) Continent Diet (Diet: Heart healthy/Carb modified)  AMBULATORY STATUS COMMUNICATION OF NEEDS Skin   Extensive Assist Verbally Normal                       Personal Care Assistance Level of Assistance  Bathing, Feeding, Dressing Bathing Assistance: Limited assistance Feeding assistance: Independent Dressing Assistance: Limited assistance     Functional Limitations Info  Sight, Hearing, Speech Sight Info: Adequate Hearing Info: Adequate Speech Info: Impaired (Missing Teeth )    SPECIAL CARE FACTORS FREQUENCY  PT (By licensed PT), OT (By licensed OT)     PT Frequency:  (5) OT Frequency:  (5)            Contractures      Additional Factors Info  Code Status, Allergies, Insulin Sliding Scale Code Status Info:  (DNR) Allergies Info:  (Lipitor Atorvastatin, Lisinopril)   Insulin Sliding Scale Info:  (NovoLog, Lantus)        Current Medications (10/06/2016):  This is the current hospital active medication list Current Facility-Administered Medications  Medication Dose Route Frequency Provider Last Rate Last Dose  . acetaminophen (TYLENOL) tablet 650 mg  650 mg Oral Q6H PRN Demetrios Loll, MD      . aspirin chewable tablet 81 mg  81 mg Oral Daily Demetrios Loll, MD   81 mg at 10/05/16 0829  . carvedilol (COREG) tablet 6.25 mg  6.25 mg Oral BID WC Demetrios Loll, MD   6.25 mg at 10/05/16 1656  . digoxin (LANOXIN) tablet 0.25 mg  0.25 mg Oral Daily Demetrios Loll, MD   0.25 mg at 10/05/16 0829  . diltiazem (CARDIZEM CD) 24 hr capsule 120 mg  120 mg Oral Daily Demetrios Loll, MD   120 mg at 10/05/16 0829  . feeding supplement (ENSURE ENLIVE) (ENSURE ENLIVE) liquid 237 mL  237 mL Oral BID BM Fritzi Mandes, MD   237 mL at 10/05/16 1424  . guaiFENesin-dextromethorphan (ROBITUSSIN DM) 100-10 MG/5ML syrup 5 mL  5 mL Oral Q4H PRN Demetrios Loll, MD      . insulin aspart (novoLOG) injection 0-5 Units  0-5 Units Subcutaneous QHS Demetrios Loll, MD      . insulin aspart (novoLOG) injection 0-9 Units  0-9 Units Subcutaneous TID WC Demetrios Loll, MD   3 Units at 10/05/16 1655  . insulin glargine (LANTUS) injection 40 Units  40 Units Subcutaneous Daily Demetrios Loll, MD  40 Units at 10/05/16 0830  . levofloxacin (LEVAQUIN) IVPB 500 mg  500 mg Intravenous Q24H Demetrios Loll, MD   500 mg at 10/05/16 1205  . lisinopril (PRINIVIL,ZESTRIL) tablet 2.5 mg  2.5 mg Oral Daily Vipul Shah, MD      . loratadine (CLARITIN) tablet 10 mg  10 mg Oral Daily PRN Demetrios Loll, MD      . rivaroxaban Alveda Reasons) tablet 20 mg  20 mg Oral Q supper Fritzi Mandes, MD      . rosuvastatin (CRESTOR) tablet 40 mg  40 mg Oral QHS Demetrios Loll, MD   40 mg at 10/04/16 2209     Discharge Medications: Please see discharge summary for a list of discharge medications.  Relevant Imaging Results:  Relevant Lab Results:   Additional Information  (SSN: [id number])  Danie Chandler, Student-Social Work

## 2016-10-06 NOTE — Progress Notes (Signed)
Per MD patient will likely D/C tomorrow. Plan is for patient to D/C to [facility] for rehab. Daughter Carol-Ann and [facility] admissions coordinator at [facility] is aware of above.   [employer], LCSW [phone]

## 2016-10-06 NOTE — Clinical Social Work Placement (Signed)
   CLINICAL SOCIAL WORK PLACEMENT  NOTE  Date:  10/06/2016  Patient Details  Name: Robert Rojas MRN: [id number] Date of Birth: April 27, 1930  Clinical Social Work is seeking post-discharge placement for this patient at the [place] level of care (*CSW will initial, date and re-position this form in  chart as items are completed):  Yes   Patient/family provided with [facility] Work Department's list of facilities offering this level of care within the geographic area requested by the patient (or if unable, by the patient's family).  Yes   Patient/family informed of their freedom to choose among providers that offer the needed level of care, that participate in Medicare, Medicaid or managed care program needed by the patient, have an available bed and are willing to accept the patient.  Yes   Patient/family informed of [facility]'s ownership interest in [facility] and [facility], as well as of the fact that they are under no obligation to receive care at these facilities.  PASRR submitted to EDS on       PASRR number received on       Existing PASRR number confirmed on 10/06/16     FL2 transmitted to all facilities in geographic area requested by pt/family on 10/06/16     FL2 transmitted to all facilities within larger geographic area on 10/06/16     Patient informed that his/her managed care company has contracts with or will negotiate with certain facilities, including the following:        Yes   Patient/family informed of bed offers received.  Patient chooses bed at  [facility])     Physician recommends and patient chooses bed at      Patient to be transferred to  [facility]) on  .  Patient to be transferred to facility by       Patient family notified on   of transfer.  Name of family member notified:        PHYSICIAN       Additional Comment:    _______________________________________________ Danie Chandler, [place] Work 10/06/2016, 9:24 AM

## 2016-10-06 NOTE — Progress Notes (Signed)
[facility] [place] NAME: Robert Rojas    MR#:  [id number]  DATE OF BIRTH:  12/22/1929  SUBJECTIVE:  Came in with confusion and sob with weakness. Pleasantly confused, feels hot on touch, may be sweaty.  Also REVIEW OF SYSTEMS:   Review of Systems  Unable to perform ROS: Medical condition   Tolerating Diet:yes Tolerating PT: SNF DRUG ALLERGIES:   Allergies  Allergen Reactions  . Lipitor [Atorvastatin]   . Lisinopril    VITALS:  Blood pressure (!) 144/54, pulse 78, temperature 98.6 F (37 C), temperature source Oral, resp. rate 18, height 5\' 10"  (1.778 m), weight 88.5 kg (195 lb), SpO2 100 %. PHYSICAL EXAMINATION:  Physical Exam GENERAL:  83 y.o.-year-old patient lying in the bed with no acute distress. feels hot on touch, may be sweaty  as well EYES: Pupils equal, round, reactive to light and accommodation. No scleral icterus. Extraocular muscles intact.  HEENT: Head atraumatic, normocephalic. Oropharynx and nasopharynx clear.  NECK:  Supple, no jugular venous distention. No thyroid enlargement, no tenderness.  LUNGS: Normal breath sounds bilaterally, no wheezing, rales, rhonchi. No use of accessory muscles of respiration.  CARDIOVASCULAR: S1, S2 normal. No murmurs, rubs, or gallops.  ABDOMEN: Soft, nontender, nondistended. Bowel sounds present. No organomegaly or mass.  EXTREMITIES: No cyanosis, clubbing or edema b/l.    NEUROLOGIC: Cranial nerves II through XII are intact. No focal Motor or sensory deficits b/l.   PSYCHIATRIC:  patient is alert and confused SKIN: No obvious rash, lesion, or ulcer.  LABORATORY PANEL:  CBC  Recent Labs Lab 10/05/16 0401  WBC 15.4*  HGB 12.5*  HCT 36.0*  PLT 206    Chemistries   Recent Labs Lab 10/05/16 0401  NA 137  K 3.4*  CL 105  CO2 26  GLUCOSE 77  BUN 16  CREATININE 1.02  CALCIUM 8.1*   Cardiac Enzymes No results for input(s): TROPONINI in the last 168  hours. RADIOLOGY:  Dg Chest 2 View  Result Date: 10/04/2016 CLINICAL DATA:  chest pain, weakness EXAM: CHEST  2 VIEW COMPARISON:  07/27/2015 FINDINGS: Patchy airspace opacities have developed throughout the right lung. Minimal patchy densities at the left base. Normal heart size. No pneumothorax. No pleural effusion. Stable mid-level thoracic compression deformity. IMPRESSION: Patchy airspace opacities right greater than left worrisome for bronchopneumonia. Followup PA and lateral chest X-ray is recommended in 3-4 weeks following trial of antibiotic therapy to ensure resolution and exclude underlying malignancy. Electronically Signed   By: Marybelle Killings M.D.   On: 10/04/2016 12:03   ASSESSMENT AND PLAN:  Robert Rojas  is a 83 y.o. male with a known history of Hypertension, diabetes, CVA, hyperlipidemia and CAD. The patient was sent from home to the ED due to weakness. The patient is poor historian. He was noticed weakness by his daughter.   * Sepsis due to pneumonia, CAP/right lung Continue antibiotics, follow-up CBC and cultures. NEB when necessary. - Check pro calcitonin, and if it is low or normal, we will stop antibiotics.  Discussed with daughter who is in agreement  *Acute renal failure. Hold Lasix and resume lisinopril, give IV fluid support and follow-up BMP. -creat 0000000  *Acute metabolic encephalopathy. Due to above. -?dementia underlying - Daughter seem to be in agreement - he is agreeable for palliative care evaluation.  While at facility and or consider hospice if he qualifies  *Diabetes: sliding scale and continue home Lantus.  *History of CVA. Continue aspirin,  xarelto and statin.  * PT recommended rehabilitation  CSW for d/c planning and placement  Case discussed with Care Management/Social Worker. Management plans discussed with the patient, family (discussed with daughter via phone) and they are in agreement.  CODE STATUS: DO NOT RESUSCITATE  DVT Prophylaxis:  xarelto  TOTAL TIME TAKING CARE OF THIS PATIENT: 62minutes.   >50% time spent on counselling and coordination of care  POSSIBLE D/C IN a.m., DEPENDING ON CLINICAL CONDITION.  Note: This dictation was prepared with Dragon dictation along with smaller phrase technology. Any transcriptional errors that result from this process are unintentional.  Max Sane M.D on 10/06/2016 at 7:33 AM  Between 7am to 6pm - Pager - [phone]  After 6pm go to [email or web address] - password [email or web address] [place] [facility]  Office  [phone]  CC: Primary care physician; Perrin Maltese, MD

## 2016-10-06 NOTE — Clinical Social Work Note (Signed)
Clinical Social Work Assessment  Patient Details  Name: Robert Rojas MRN: [id number] Date of Birth: 1930/03/20  Date of referral:  10/06/16               Reason for consult:  Facility Placement, Discharge Planning                Permission sought to share information with:  Chartered certified accountant granted to share information::  Yes, Verbal Permission Granted  Name::      [facility] SNF  Agency::   [facility])  Relationship::     Contact Information:     Housing/Transportation Living arrangements for the past 2 months:  [place] of Information:  Adult Children Patient Interpreter Needed:  None Criminal Activity/Legal Involvement Pertinent to Current Situation/Hospitalization:  No - Comment as needed Significant Relationships:  Adult Children, Spouse Lives with:  Spouse Do you feel safe going back to the place where you live?  Yes Need for family participation in patient care:  Yes (Comment)  Care giving concerns:  Patient lives at home with his wife in [place].   Social Worker assessment / plan:  Social work Theatre manager received social work consult. PT is recommending SNF. Social work Theatre manager met with patient at beside. Social work Theatre manager introduced herself and explained roles of social work department. Patient was confused. Social work Theatre manager called patient's daughter, Robert Rojas to complete assessment. Per patient's daughter, patient lives at home in [place] with his wife. Patient has several children but daughter Robert Rojas is the only child that lives in [place]. Daughter Robert Rojas is patient's HPOA. Social work Theatre manager confirmed that that patient's daughter spoke with RN case manager yesterday about patient's discharge plans. Patient's daughter confirmed and said that she is hoping patient can go to [facility] for rehab. Social work Theatre manager explained to patient's daughter that patient can go to [facility] and that patient may be discharged today, if not tomorrow. Patient's daughter verbally agreed she understood and was pleased to know that patient can go to [facility] for rehab. Magda Paganini, admissions coordinator at [facility] is aware of accepted bed offer. Social work Theatre manager will continue to assist and follow as needed.   Fl2 completed and faxed out.   Employment status:  Unemployed Nurse, adult PT Recommendations:  [place] / Referral to community resources:  [place]  Patient/Family's Response to care:  Patient's daughter wants patient to go to [facility] for rehab. [facility] accepted patient offer.   Patient/Family's Understanding of and Emotional Response to Diagnosis, Current Treatment, and Prognosis:  Patient's daughter was pleasant and thanked social work Theatre manager for coming by.   Emotional Assessment Appearance:  Appears stated age Attitude/Demeanor/Rapport:    Affect (typically observed):  Accepting, Adaptable, Appropriate Orientation:  Oriented to Self, Oriented to Place Alcohol / Substance use:  Not Applicable Psych involvement (Current and /or in the community):  No (Comment)  Discharge Needs  Concerns to be addressed:  Basic Needs Readmission within the last 30 days:  No Current discharge risk:  Chronically ill Barriers to Discharge:  Continued Medical Work up   [employer] Work 10/06/2016, 9:26 AM

## 2016-10-07 LAB — GLUCOSE, CAPILLARY
Glucose-Capillary: 97 mg/dL (ref 65–99)
Glucose-Capillary: 251 mg/dL — ABNORMAL HIGH (ref 65–99)

## 2016-10-07 LAB — BASIC METABOLIC PANEL
ANION GAP: 7 (ref 5–15)
BUN: 23 mg/dL — ABNORMAL HIGH (ref 6–20)
CALCIUM: 8.4 mg/dL — AB (ref 8.9–10.3)
CO2: 26 mmol/L (ref 22–32)
Chloride: 106 mmol/L (ref 101–111)
Creatinine, Ser: 1.31 mg/dL — ABNORMAL HIGH (ref 0.61–1.24)
GFR calc Af Amer: 56 mL/min — ABNORMAL LOW (ref >60)
GFR, EST NON AFRICAN AMERICAN: 48 mL/min — AB (ref >60)
GLUCOSE: 106 mg/dL — AB (ref 65–99)
Potassium: 3.6 mmol/L (ref 3.5–5.1)
SODIUM: 139 mmol/L (ref 135–145)

## 2016-10-07 LAB — CBC
HCT: 35.2 % — ABNORMAL LOW (ref 40.0–52.0)
Hemoglobin: 12.4 g/dL — ABNORMAL LOW (ref 13.0–18.0)
MCH: 31.3 pg (ref 26.0–34.0)
MCHC: 35.2 g/dL (ref 32.0–36.0)
MCV: 88.9 fL (ref 80.0–100.0)
PLATELETS: 216 10*3/uL (ref 150–440)
RBC: 3.96 MIL/uL — ABNORMAL LOW (ref 4.40–5.90)
RDW: 14.3 % (ref 11.5–14.5)
WBC: 11.5 10*3/uL — AB (ref 3.8–10.6)

## 2016-10-07 NOTE — Discharge Summary (Signed)
[place] [facility] [place] NAME: Robert Rojas    MR#:  [id number]  DATE OF BIRTH:  Aug 18, 1930  DATE OF ADMISSION:  10/04/2016   ADMITTING PHYSICIAN: Robert Loll, MD  DATE OF DISCHARGE: 10/07/2016  PRIMARY CARE PHYSICIAN: Robert Maltese, MD   ADMISSION DIAGNOSIS:  Weakness [R53.1] Community acquired pneumonia, unspecified laterality [J18.9] DISCHARGE DIAGNOSIS:  Active Problems:   Pneumonia  SECONDARY DIAGNOSIS:   Past Medical History:  Diagnosis Date  . Arthralgia    left knee/patella/tibia/fibula  . BCC (basal cell carcinoma)   . Coronary artery disease   . CVA (cerebral infarction)    left sided  . Erectile dysfunction   . Essential hypertension   . GERD (gastroesophageal reflux disease)   . Hyperlipemia   . MI (myocardial infarction) 01/2011  . Prostatitis   . Spinal stenosis   . TIA (transient ischemic attack)   . Type 2 diabetes mellitus [facility])    HOSPITAL COURSE:  [name] a 83 y.o.malewith a known history of Hypertension, diabetes, CVA, hyperlipidemia and CAD. The patient was sent from home to the ED due to weakness. The patient is poor historian. He was noticed to have weakness by his daughter.   * Sepsis: due to pneumonia, CAP/right lung - treated. - Continue antibiotics, follow-up CBC and cultures. Nebs when necessary. - Normal/low pro calcitonin, so will stop antibiotics.  Discussed with daughter who is in agreement  * Acute renal failure: likely prerenal. - creat 0000000  * Acute metabolic encephalopathy: Due to sepsis with underlying dementia  - Daughter seem to be in agreement with palliative care evaluation.  While at facility and or consider hospice if he qualifies DISCHARGE CONDITIONS:  stable CONSULTS OBTAINED:   DRUG ALLERGIES:   Allergies  Allergen Reactions  . Lipitor [Atorvastatin]   . Lisinopril    DISCHARGE MEDICATIONS:   Allergies as of 10/07/2016      Reactions   Lipitor  [atorvastatin]    Lisinopril       Medication List    TAKE these medications   aspirin 81 MG tablet Take 81 mg by mouth daily.   carvedilol 6.25 MG tablet Commonly known as:  COREG Take 6.25 mg by mouth 2 (two) times daily with a meal.   cetirizine 10 MG tablet Commonly known as:  ZYRTEC TAKE 1 TABLET BY MOUTH AT BEDTIME FOR NASAL DRIP   digoxin 0.25 MG tablet Commonly known as:  LANOXIN Take 0.25 mg by mouth daily.   diltiazem 120 MG 24 hr capsule Commonly known as:  CARDIZEM CD Take 120 mg by mouth daily.   furosemide 40 MG tablet Commonly known as:  LASIX Take 40 mg by mouth daily.   HUMALOG KWIKPEN 100 UNIT/ML KiwkPen Generic drug:  insulin lispro USE AS DIRECTED FOR SLIDING SCALE FOUR TIMES DAILY. MAX OF 48 UNITS PER DAY   JANUVIA 100 MG tablet Generic drug:  sitaGLIPtin Take 100 mg by mouth daily.   KLOR-CON M20 20 MEQ tablet Generic drug:  potassium chloride SA Take 20 mEq by mouth daily.   LANTUS SOLOSTAR 100 UNIT/ML Solostar Pen Generic drug:  Insulin Glargine Inject 40 Units into the skin daily.   lisinopril 2.5 MG tablet Commonly known as:  PRINIVIL,ZESTRIL Take 2.5 mg by mouth daily.   rosuvastatin 40 MG tablet Commonly known as:  CRESTOR Take 40 mg by mouth at bedtime.   XARELTO 15 MG Tabs tablet Generic drug:  Rivaroxaban Take 1 tablet by mouth daily.  DISCHARGE INSTRUCTIONS:   DIET:  Regular diet DISCHARGE CONDITION:  Good ACTIVITY:  Activity as tolerated OXYGEN:  Home Oxygen: No.  Oxygen Delivery: room air DISCHARGE LOCATION:  nursing home with Palliative care eval while at the facility - consider Hospice if clinically worsen  If you experience worsening of your admission symptoms, develop shortness of breath, life threatening emergency, suicidal or homicidal thoughts you must seek medical attention immediately by calling 911 or calling your MD immediately  if symptoms less severe.  You Must read complete  instructions/literature along with all the possible adverse reactions/side effects for all the Medicines you take and that have been prescribed to you. Take any new Medicines after you have completely understood and accpet all the possible adverse reactions/side effects.   Please note  You were cared for by a hospitalist during your hospital stay. If you have any questions about your discharge medications or the care you received while you were in the hospital after you are discharged, you can call the unit and asked to speak with the hospitalist on call if the hospitalist that took care of you is not available. Once you are discharged, your primary care physician will handle any further medical issues. Please note that NO REFILLS for any discharge medications will be authorized once you are discharged, as it is imperative that you return to your primary care physician (or establish a relationship with a primary care physician if you do not have one) for your aftercare needs so that they can reassess your need for medications and monitor your lab values.    On the day of Discharge:  VITAL SIGNS:  Blood pressure (!) 129/49, pulse 66, temperature 99.8 F (37.7 C), temperature source Oral, resp. rate 18, height 5\' 10"  (1.778 m), weight 88.5 kg (195 lb), SpO2 99 %. PHYSICAL EXAMINATION:  GENERAL:  83 y.o.-year-old patient lying in the bed with no acute distress.  EYES: Pupils equal, round, reactive to light and accommodation. No scleral icterus. Extraocular muscles intact.  HEENT: Head atraumatic, normocephalic. Oropharynx and nasopharynx clear.  NECK:  Supple, no jugular venous distention. No thyroid enlargement, no tenderness.  LUNGS: Normal breath sounds bilaterally, no wheezing, rales,rhonchi or crepitation. No use of accessory muscles of respiration.  CARDIOVASCULAR: S1, S2 normal. No murmurs, rubs, or gallops.  ABDOMEN: Soft, non-tender, non-distended. Bowel sounds present. No organomegaly or  mass.  EXTREMITIES: No pedal edema, cyanosis, or clubbing.  NEUROLOGIC: Cranial nerves II through XII are intact. Muscle strength 5/5 in all extremities. Sensation intact. Gait not checked.  PSYCHIATRIC: The patient is alert. Pleasantly confused. SKIN: No obvious rash, lesion, or ulcer.  DATA REVIEW:   CBC  Recent Labs Lab 10/07/16 0451  WBC 11.5*  HGB 12.4*  HCT 35.2*  PLT 216    Chemistries   Recent Labs Lab 10/07/16 0451  NA 139  K 3.6  CL 106  CO2 26  GLUCOSE 106*  BUN 23*  CREATININE 1.31*  CALCIUM 8.4*     Contact information for follow-up providers    Robert Maltese, MD. Schedule an appointment as soon as possible for a visit in 1 week(s).   Specialty:  Internal Medicine Contact information: [place] [address] [phone]            Contact information for after-discharge care    [facility] Follow up.   Specialty:  [place] information: [place] [phone]  Management plans discussed with the patient, family (daughter) and they are in agreement.  CODE STATUS: DNR, Palliative care eval while at the facility   [place] THIS PATIENT: 45 minutes.    Max Sane M.D on 10/07/2016 at 8:02 AM  Between 7am to 6pm - Pager - [phone]  After 6pm go to [email or web address] - Proofreader  [facility] [place] [facility]  Office  [phone]  CC: Primary care physician; Robert Maltese, MD   Note: This dictation was prepared with Dragon dictation along with smaller phrase technology. Any transcriptional errors that result from this process are unintentional.

## 2016-10-07 NOTE — Progress Notes (Signed)
Patient is medically stable for D/C to [facility] today. Per [facility] admissions coordinator at [facility] patient will go to room 403. RN will call report and arrange EMS for transport. Clinical Education officer, museum (CSW) sent D/C orders to [facility]. Patient is aware of above. CSW contacted patient's daughter [name] and made her aware of above. Please reconsult if future social work needs arise. CSW signing off.   [employer], LCSW [phone]

## 2016-10-07 NOTE — Clinical Social Work Placement (Signed)
   CLINICAL SOCIAL WORK PLACEMENT  NOTE  Date:  10/07/2016  Patient Details  Name: Robert Rojas MRN: [id number] Date of Birth: February 11, 1930  Clinical Social Work is seeking post-discharge placement for this patient at the [place] level of care (*CSW will initial, date and re-position this form in  chart as items are completed):  Yes   Patient/family provided with [place] Work Department's list of facilities offering this level of care within the geographic area requested by the patient (or if unable, by the patient's family).  Yes   Patient/family informed of their freedom to choose among providers that offer the needed level of care, that participate in Medicare, Medicaid or managed care program needed by the patient, have an available bed and are willing to accept the patient.  Yes   Patient/family informed of [place]'s ownership interest in [facility] and [facility], as well as of the fact that they are under no obligation to receive care at these facilities.  PASRR submitted to EDS on       PASRR number received on       Existing PASRR number confirmed on 10/06/16     FL2 transmitted to all facilities in geographic area requested by pt/family on 10/06/16     FL2 transmitted to all facilities within larger geographic area on       Patient informed that his/her managed care company has contracts with or will negotiate with certain facilities, including the following:        Yes   Patient/family informed of bed offers received.  Patient chooses bed at  [facility])     Physician recommends and patient chooses bed at      Patient to be transferred to  ([facility]) on 10/07/16.  Patient to be transferred to facility by  [facility] )     Patient family notified on 10/07/16 of transfer.  Name of family member notified:   (Patient's daughter [name] is aware of D/C today. )     PHYSICIAN        Additional Comment:    _______________________________________________ Vaidehi Braddy, Veronia Beets, LCSW 10/07/2016, 12:57 PM

## 2016-10-07 NOTE — Care Management (Signed)
Patient discharging to [facility] today per CSW. I have updated [facility] at home of patient discharge. No further RNCM needs.

## 2016-10-07 NOTE — Progress Notes (Signed)
RN gave report to RN at [facility]. Patient alert and oriented to self and place. EMS called for report.   Deri Fuelling, RN

## 2016-10-07 NOTE — Discharge Instructions (Signed)
Deconditioning °Deconditioning refers to the changes in your body that occur during a period of inactivity. The changes happen in your heart, lungs, and muscles. They decrease your ability to be active, and they make you feel tired and weak. °There are three stages of deconditioning: °· Mild deconditioning. At this stage, you will notice a change in your ability to do your usual exercise activities, such as running, biking, or swimming. °· Moderate deconditioning. At this stage, you will notice a change in your ability to do normal everyday activities, such as walking, grocery shopping, and doing chores. °· Severe deconditioning. At this stage, you will notice a change in your ability to do minimal activity or normal self-care. °Deconditioning can occur after only a few days of inactivity. The longer the period of inactivity, the more severe the deconditioning will be, and the longer it will take to return to your previous level of functioning. °What are the causes? °Deconditioning is often caused by inactivity due to: °· Illnesses, such as cancer, stroke, heart attack, fibromyalgia, and chronic fatigue syndrome. °· Injuries, especially back injuries, broken bones, and ligament and tendon injuries. °· A long stay in the hospital. °· Pregnancy, especially if long periods of bed rest are needed. °What increases the risk? °This condition is more likely to develop in: °· People who are hospitalized. °· People on bed rest. °· People who are obese. °· People with poor nutrition. °· Elderly adults. °· People with injuries or illnesses that interfere with movement and activity. °What are the signs or symptoms? °Symptoms of deconditioning include: °· Weakness. °· Tiredness. °· Shortness of breath with minor exertion. °· A faster-than-normal heartbeat. You may not notice this without taking your pulse. °· Pain or discomfort with activity. °· Decreased strength. °· Decreased sense of balance. °· Decreased  endurance. °· Difficulty doing your usual forms of exercise. °· Difficulty doing activities of daily living, such as grocery shopping or chores. °· Difficulty walking around the house and doing basic self-care, such as getting to the bathroom, preparing meals, or doing laundry. °How is this diagnosed? °Deconditioning is diagnosed based on your medical history and a physical exam. During the physical exam, your health care provider will check for signs of deconditioning, such as: °· Decreased size of muscles. °· Decreased strength. °· Trouble with balance. °· Shortness of breath or abnormally increased heart rate after minor exertion. °How is this treated? °Treatment for deconditioning usually involves following a structured exercise program in which activity is increased gradually. Your health care provider will determine which exercises are right for you. The exercise program will likely include aerobic exercise and strength training: °· Aerobic exercise helps improve the functioning of the heart and lungs as well as the muscles. °· Strength training helps improve muscle size and strength. °Both of these types of exercise will improve your endurance. You may be referred to a physical therapist who can create a safe strengthening program for you to follow. °Follow these instructions at home: °· Follow the exercise program that is recommended by your health care provider or physical therapist. °· Do not increase your exercise any faster than directed. °· Eat a healthy diet. °· Do not use any products that contain nicotine or tobacco, such as cigarettes and e-cigarettes. If you need help quitting, ask your health care provider. °· Take over-the-counter and prescription medicines only as told by your health care provider. °· Keep all follow-up visits as told by your health care provider. This is important. °Contact a   health care provider if: °· You are not able to carry out the prescribed exercise program. °· You are  becoming more and more fatigued and weak. °· You become light-headed when rising to a sitting or standing position. °· Your level of endurance decreases after it has improved. °Get help right away if: °· You have chest pain. °· You are very short of breath. °· You have any episodes of passing out. °This information is not intended to replace advice given to you by your health care provider. Make sure you discuss any questions you have with your health care provider. °Document Released: 01/13/2014 Document Revised: 03/18/2016 Document Reviewed: 11/28/2015 °Elsevier Interactive Patient Education © 2017 Elsevier Inc. ° °

## 2016-10-09 LAB — CULTURE, BLOOD (ROUTINE X 2)
CULTURE: NO GROWTH
Culture: NO GROWTH

## 2016-10-10 ENCOUNTER — Emergency Department (HOSPITAL_COMMUNITY): Payer: Medicare Other

## 2016-10-10 ENCOUNTER — Inpatient Hospital Stay (HOSPITAL_COMMUNITY)
Admission: EM | Admit: 2016-10-10 | Discharge: 2016-10-13 | DRG: 067 | Disposition: A | Discharge status: Skilled Nursing Facility | Payer: Medicare Other | Attending: Internal Medicine | Admitting: Internal Medicine

## 2016-10-10 ENCOUNTER — Encounter (HOSPITAL_COMMUNITY): Payer: Self-pay | Admitting: Internal Medicine

## 2016-10-10 DIAGNOSIS — R200 Anesthesia of skin: Secondary | ICD-10-CM | POA: Diagnosis not present

## 2016-10-10 DIAGNOSIS — F0390 Unspecified dementia without behavioral disturbance: Secondary | ICD-10-CM | POA: Diagnosis present

## 2016-10-10 DIAGNOSIS — J189 Pneumonia, unspecified organism: Secondary | ICD-10-CM | POA: Diagnosis present

## 2016-10-10 DIAGNOSIS — I459 Conduction disorder, unspecified: Secondary | ICD-10-CM | POA: Diagnosis present

## 2016-10-10 DIAGNOSIS — R7989 Other specified abnormal findings of blood chemistry: Secondary | ICD-10-CM

## 2016-10-10 DIAGNOSIS — R05 Cough: Secondary | ICD-10-CM

## 2016-10-10 DIAGNOSIS — Z8249 Family history of ischemic heart disease and other diseases of the circulatory system: Secondary | ICD-10-CM

## 2016-10-10 DIAGNOSIS — I4891 Unspecified atrial fibrillation: Secondary | ICD-10-CM | POA: Diagnosis present

## 2016-10-10 DIAGNOSIS — Z66 Do not resuscitate: Secondary | ICD-10-CM | POA: Diagnosis present

## 2016-10-10 DIAGNOSIS — Z955 Presence of coronary angioplasty implant and graft: Secondary | ICD-10-CM

## 2016-10-10 DIAGNOSIS — I6601 Occlusion and stenosis of right middle cerebral artery: Secondary | ICD-10-CM | POA: Diagnosis not present

## 2016-10-10 DIAGNOSIS — I2510 Atherosclerotic heart disease of native coronary artery without angina pectoris: Secondary | ICD-10-CM | POA: Diagnosis present

## 2016-10-10 DIAGNOSIS — I5022 Chronic systolic (congestive) heart failure: Secondary | ICD-10-CM | POA: Diagnosis present

## 2016-10-10 DIAGNOSIS — D72829 Elevated white blood cell count, unspecified: Secondary | ICD-10-CM

## 2016-10-10 DIAGNOSIS — Z888 Allergy status to other drugs, medicaments and biological substances status: Secondary | ICD-10-CM

## 2016-10-10 DIAGNOSIS — Z794 Long term (current) use of insulin: Secondary | ICD-10-CM

## 2016-10-10 DIAGNOSIS — Z8701 Personal history of pneumonia (recurrent): Secondary | ICD-10-CM

## 2016-10-10 DIAGNOSIS — I69354 Hemiplegia and hemiparesis following cerebral infarction affecting left non-dominant side: Secondary | ICD-10-CM | POA: Diagnosis not present

## 2016-10-10 DIAGNOSIS — R9431 Abnormal electrocardiogram [ECG] [EKG]: Secondary | ICD-10-CM | POA: Diagnosis present

## 2016-10-10 DIAGNOSIS — R066 Hiccough: Secondary | ICD-10-CM | POA: Diagnosis not present

## 2016-10-10 DIAGNOSIS — Y95 Nosocomial condition: Secondary | ICD-10-CM | POA: Diagnosis present

## 2016-10-10 DIAGNOSIS — N179 Acute kidney failure, unspecified: Secondary | ICD-10-CM

## 2016-10-10 DIAGNOSIS — I6603 Occlusion and stenosis of bilateral middle cerebral arteries: Principal | ICD-10-CM | POA: Diagnosis present

## 2016-10-10 DIAGNOSIS — I110 Hypertensive heart disease with heart failure: Secondary | ICD-10-CM | POA: Diagnosis present

## 2016-10-10 DIAGNOSIS — R748 Abnormal levels of other serum enzymes: Secondary | ICD-10-CM | POA: Diagnosis not present

## 2016-10-10 DIAGNOSIS — E785 Hyperlipidemia, unspecified: Secondary | ICD-10-CM | POA: Diagnosis present

## 2016-10-10 DIAGNOSIS — Z85828 Personal history of other malignant neoplasm of skin: Secondary | ICD-10-CM

## 2016-10-10 DIAGNOSIS — E119 Type 2 diabetes mellitus without complications: Secondary | ICD-10-CM | POA: Diagnosis present

## 2016-10-10 DIAGNOSIS — I248 Other forms of acute ischemic heart disease: Secondary | ICD-10-CM | POA: Diagnosis present

## 2016-10-10 DIAGNOSIS — K219 Gastro-esophageal reflux disease without esophagitis: Secondary | ICD-10-CM | POA: Diagnosis present

## 2016-10-10 DIAGNOSIS — Z7982 Long term (current) use of aspirin: Secondary | ICD-10-CM

## 2016-10-10 DIAGNOSIS — Z79899 Other long term (current) drug therapy: Secondary | ICD-10-CM

## 2016-10-10 DIAGNOSIS — I69322 Dysarthria following cerebral infarction: Secondary | ICD-10-CM

## 2016-10-10 DIAGNOSIS — R531 Weakness: Secondary | ICD-10-CM

## 2016-10-10 DIAGNOSIS — I10 Essential (primary) hypertension: Secondary | ICD-10-CM | POA: Diagnosis not present

## 2016-10-10 DIAGNOSIS — J40 Bronchitis, not specified as acute or chronic: Secondary | ICD-10-CM | POA: Diagnosis present

## 2016-10-10 DIAGNOSIS — Z7901 Long term (current) use of anticoagulants: Secondary | ICD-10-CM

## 2016-10-10 DIAGNOSIS — I252 Old myocardial infarction: Secondary | ICD-10-CM | POA: Diagnosis not present

## 2016-10-10 DIAGNOSIS — R2990 Unspecified symptoms and signs involving the nervous system: Secondary | ICD-10-CM | POA: Diagnosis not present

## 2016-10-10 DIAGNOSIS — R059 Cough, unspecified: Secondary | ICD-10-CM

## 2016-10-10 DIAGNOSIS — Z833 Family history of diabetes mellitus: Secondary | ICD-10-CM

## 2016-10-10 DIAGNOSIS — R778 Other specified abnormalities of plasma proteins: Secondary | ICD-10-CM | POA: Diagnosis present

## 2016-10-10 DIAGNOSIS — I639 Cerebral infarction, unspecified: Secondary | ICD-10-CM

## 2016-10-10 DIAGNOSIS — R471 Dysarthria and anarthria: Secondary | ICD-10-CM

## 2016-10-10 DIAGNOSIS — R4781 Slurred speech: Secondary | ICD-10-CM | POA: Diagnosis present

## 2016-10-10 DIAGNOSIS — I259 Chronic ischemic heart disease, unspecified: Secondary | ICD-10-CM | POA: Diagnosis not present

## 2016-10-10 DIAGNOSIS — I6609 Occlusion and stenosis of unspecified middle cerebral artery: Secondary | ICD-10-CM | POA: Diagnosis present

## 2016-10-10 DIAGNOSIS — E118 Type 2 diabetes mellitus with unspecified complications: Secondary | ICD-10-CM

## 2016-10-10 HISTORY — DX: Dysarthria and anarthria: R47.1

## 2016-10-10 LAB — URINALYSIS, COMPLETE (UACMP) WITH MICROSCOPIC
BILIRUBIN URINE: NEGATIVE
GLUCOSE, UA: 50 mg/dL — AB
KETONES UR: NEGATIVE mg/dL
Nitrite: NEGATIVE
PROTEIN: 30 mg/dL — AB
Specific Gravity, Urine: 1.019 (ref 1.005–1.030)
pH: 5 (ref 5.0–8.0)

## 2016-10-10 LAB — I-STAT CHEM 8, ED
BUN: 32 mg/dL — AB (ref 6–20)
CALCIUM ION: 1.1 mmol/L — AB (ref 1.15–1.40)
CHLORIDE: 103 mmol/L (ref 101–111)
CREATININE: 1.7 mg/dL — AB (ref 0.61–1.24)
GLUCOSE: 247 mg/dL — AB (ref 65–99)
HCT: 36 % — ABNORMAL LOW (ref 39.0–52.0)
Hemoglobin: 12.2 g/dL — ABNORMAL LOW (ref 13.0–17.0)
Potassium: 3.8 mmol/L (ref 3.5–5.1)
Sodium: 142 mmol/L (ref 135–145)
TCO2: 28 mmol/L (ref 0–100)

## 2016-10-10 LAB — GLUCOSE, CAPILLARY
GLUCOSE-CAPILLARY: 195 mg/dL — AB (ref 65–99)
Glucose-Capillary: 226 mg/dL — ABNORMAL HIGH (ref 65–99)

## 2016-10-10 LAB — DIFFERENTIAL
BASOS ABS: 0.2 10*3/uL — AB (ref 0.0–0.1)
Basophils Relative: 1 %
Eosinophils Absolute: 0.2 10*3/uL (ref 0.0–0.7)
Eosinophils Relative: 1 %
LYMPHS ABS: 1.9 10*3/uL (ref 0.7–4.0)
Lymphocytes Relative: 10 %
MONO ABS: 1.4 10*3/uL — AB (ref 0.1–1.0)
Monocytes Relative: 7 %
NEUTROS ABS: 15.7 10*3/uL — AB (ref 1.7–7.7)
Neutrophils Relative %: 81 %

## 2016-10-10 LAB — I-STAT TROPONIN, ED
TROPONIN I, POC: 0.47 ng/mL — AB (ref 0.00–0.08)
Troponin i, poc: 0.46 ng/mL (ref 0.00–0.08)

## 2016-10-10 LAB — CBC
HEMATOCRIT: 37 % — AB (ref 39.0–52.0)
Hemoglobin: 12.5 g/dL — ABNORMAL LOW (ref 13.0–17.0)
MCH: 30.7 pg (ref 26.0–34.0)
MCHC: 33.8 g/dL (ref 30.0–36.0)
MCV: 90.9 fL (ref 78.0–100.0)
Platelets: 283 10*3/uL (ref 150–400)
RBC: 4.07 MIL/uL — ABNORMAL LOW (ref 4.22–5.81)
RDW: 13.8 % (ref 11.5–15.5)
WBC: 19.4 10*3/uL — ABNORMAL HIGH (ref 4.0–10.5)

## 2016-10-10 LAB — COMPREHENSIVE METABOLIC PANEL
ALBUMIN: 2.7 g/dL — AB (ref 3.5–5.0)
ALT: 45 U/L (ref 17–63)
ANION GAP: 9 (ref 5–15)
AST: 47 U/L — ABNORMAL HIGH (ref 15–41)
Alkaline Phosphatase: 67 U/L (ref 38–126)
BILIRUBIN TOTAL: 1.1 mg/dL (ref 0.3–1.2)
BUN: 32 mg/dL — ABNORMAL HIGH (ref 6–20)
CALCIUM: 8.7 mg/dL — AB (ref 8.9–10.3)
CO2: 27 mmol/L (ref 22–32)
Chloride: 106 mmol/L (ref 101–111)
Creatinine, Ser: 1.77 mg/dL — ABNORMAL HIGH (ref 0.61–1.24)
GFR, EST AFRICAN AMERICAN: 39 mL/min — AB (ref >60)
GFR, EST NON AFRICAN AMERICAN: 34 mL/min — AB (ref >60)
GLUCOSE: 250 mg/dL — AB (ref 65–99)
POTASSIUM: 3.9 mmol/L (ref 3.5–5.1)
Sodium: 142 mmol/L (ref 135–145)
TOTAL PROTEIN: 6.8 g/dL (ref 6.5–8.1)

## 2016-10-10 LAB — DIGOXIN LEVEL: Digoxin Level: 1.4 ng/mL (ref 0.8–2.0)

## 2016-10-10 LAB — CBG MONITORING, ED: GLUCOSE-CAPILLARY: 242 mg/dL — AB (ref 65–99)

## 2016-10-10 LAB — APTT: aPTT: 45 seconds — ABNORMAL HIGH (ref 24–36)

## 2016-10-10 LAB — TROPONIN I: Troponin I: 0.51 ng/mL (ref <0.03)

## 2016-10-10 LAB — I-STAT CG4 LACTIC ACID, ED: Lactic Acid, Venous: 1.72 mmol/L (ref 0.5–1.9)

## 2016-10-10 LAB — PROTIME-INR
INR: 1.7
PROTHROMBIN TIME: 20.2 s — AB (ref 11.4–15.2)

## 2016-10-10 MED ORDER — BENZONATATE 100 MG PO CAPS: 100.0000 mg | ORAL_CAPSULE | Freq: Two times a day (BID) | ORAL | Status: DC | PRN

## 2016-10-10 MED ORDER — STROKE: EARLY STAGES OF RECOVERY BOOK
Freq: Once | Status: AC
  Administered 2016-10-11: 04:00:00
  Filled 2016-10-10: qty 1

## 2016-10-10 MED ORDER — DIGOXIN 125 MCG PO TABS
0.2500 mg | ORAL_TABLET | Freq: Every day | ORAL | Status: DC
  Administered 2016-10-12 – 2016-10-13 (×2): 0.25 mg via ORAL
  Filled 2016-10-10 (×2): qty 2

## 2016-10-10 MED ORDER — VANCOMYCIN HCL 10 G IV SOLR
1250.0000 mg | INTRAVENOUS | Status: DC
  Administered 2016-10-10: 1250 mg via INTRAVENOUS
  Filled 2016-10-10 (×2): qty 1250

## 2016-10-10 MED ORDER — DEXTROSE 5 % IV SOLN: 2.0000 g | Freq: Once | INTRAVENOUS | Status: DC

## 2016-10-10 MED ORDER — ACETAMINOPHEN 160 MG/5ML PO SOLN
650.0000 mg | ORAL | Status: DC | PRN
Start: 2016-10-10 — End: 2016-10-13

## 2016-10-10 MED ORDER — IPRATROPIUM-ALBUTEROL 0.5-2.5 (3) MG/3ML IN SOLN
3.0000 mL | Freq: Four times a day (QID) | RESPIRATORY_TRACT | Status: DC
  Administered 2016-10-10: 3 mL via RESPIRATORY_TRACT
  Filled 2016-10-10: qty 3

## 2016-10-10 MED ORDER — SODIUM CHLORIDE 0.9 % IV SOLN: INTRAVENOUS | Status: DC

## 2016-10-10 MED ORDER — ACETAMINOPHEN 325 MG PO TABS
650.0000 mg | ORAL_TABLET | ORAL | Status: DC | PRN
  Administered 2016-10-13 (×2): 650 mg via ORAL
  Filled 2016-10-10 (×2): qty 2

## 2016-10-10 MED ORDER — ROSUVASTATIN CALCIUM 40 MG PO TABS
40.0000 mg | ORAL_TABLET | Freq: Every day | ORAL | Status: DC
  Administered 2016-10-12: 40 mg via ORAL
  Filled 2016-10-10: qty 2
  Filled 2016-10-10 (×4): qty 1

## 2016-10-10 MED ORDER — INSULIN GLARGINE 100 UNIT/ML ~~LOC~~ SOLN
40.0000 [IU] | Freq: Every day | SUBCUTANEOUS | Status: DC
  Administered 2016-10-11 – 2016-10-13 (×3): 40 [IU] via SUBCUTANEOUS
  Filled 2016-10-10 (×3): qty 0.4

## 2016-10-10 MED ORDER — SODIUM CHLORIDE 0.9 % IV SOLN: INTRAVENOUS | Status: AC

## 2016-10-10 MED ORDER — DEXTROSE 5 % IV SOLN
2.0000 g | INTRAVENOUS | Status: DC
  Administered 2016-10-10 – 2016-10-12 (×3): 2 g via INTRAVENOUS
  Filled 2016-10-10 (×3): qty 2

## 2016-10-10 MED ORDER — VANCOMYCIN HCL IN DEXTROSE 1-5 GM/200ML-% IV SOLN: 1000.0000 mg | Freq: Once | INTRAVENOUS | Status: DC

## 2016-10-10 MED ORDER — ACETAMINOPHEN 650 MG RE SUPP
650.0000 mg | RECTAL | Status: DC | PRN
Start: 2016-10-10 — End: 2016-10-13
  Administered 2016-10-12: 650 mg via RECTAL
  Filled 2016-10-10: qty 1

## 2016-10-10 MED ORDER — SODIUM CHLORIDE 0.9 % IV BOLUS (SEPSIS)
1000.0000 mL | Freq: Once | INTRAVENOUS | Status: AC
Start: 2016-10-10 — End: 2016-10-10
  Administered 2016-10-10: 1000 mL via INTRAVENOUS

## 2016-10-10 MED ORDER — INSULIN ASPART 100 UNIT/ML ~~LOC~~ SOLN
0.0000 [IU] | Freq: Three times a day (TID) | SUBCUTANEOUS | Status: DC
  Administered 2016-10-11: 2 [IU] via SUBCUTANEOUS
  Administered 2016-10-11 (×2): 3 [IU] via SUBCUTANEOUS
  Administered 2016-10-12: 2 [IU] via SUBCUTANEOUS
  Administered 2016-10-13: 3 [IU] via SUBCUTANEOUS
  Administered 2016-10-13: 5 [IU] via SUBCUTANEOUS

## 2016-10-10 MED ORDER — INSULIN ASPART 100 UNIT/ML ~~LOC~~ SOLN: 0.0000 [IU] | Freq: Every day | SUBCUTANEOUS | Status: DC

## 2016-10-10 MED ORDER — IOPAMIDOL (ISOVUE-370) INJECTION 76%
INTRAVENOUS | Status: AC
  Filled 2016-10-10: qty 50

## 2016-10-10 MED ORDER — GADOBENATE DIMEGLUMINE 529 MG/ML IV SOLN
10.0000 mL | Freq: Once | INTRAVENOUS | Status: AC | PRN
  Administered 2016-10-10: 10 mL via INTRAVENOUS

## 2016-10-10 MED ORDER — RIVAROXABAN 15 MG PO TABS
15.0000 mg | ORAL_TABLET | Freq: Every day | ORAL | Status: DC
  Administered 2016-10-12: 15 mg via ORAL
  Filled 2016-10-10: qty 1

## 2016-10-10 NOTE — ED Notes (Signed)
Attempted report x1. 

## 2016-10-10 NOTE — ED Notes (Signed)
Pt's CBG 242.  Informed Estill Batten, RN.

## 2016-10-10 NOTE — ED Notes (Signed)
I stat Troponin results 0.47ng/mL reported to Dr. Ashok Cordia by B. Yolanda Bonine, EMT

## 2016-10-10 NOTE — ED Notes (Signed)
Patient taken to MRI

## 2016-10-10 NOTE — Progress Notes (Signed)
Pharmacy Antibiotic Note  Robert Rojas is a 83 y.o. male admitted on 10/10/2016 with pneumonia.  Pharmacy has been consulted for cefepime and vancomycin dosing.  Plan: Vancomycin 1250mg  IV every 24 hours.  Goal trough 15-20 mcg/mL.  Cefepime 2g IV q24h Monitor culture data, renal function and clinical course VT at SS  Height: 5\' 10"  (177.8 cm) Weight: 179 lb 3.7 oz (81.3 kg) IBW/kg (Calculated) : 73  Temp (24hrs), Avg:97.9 F (36.6 C), Min:97.9 F (36.6 C), Max:97.9 F (36.6 C)   Recent Labs Lab 10/04/16 0953 10/04/16 1537 10/05/16 0401 10/07/16 0451 10/10/16 1040 10/10/16 1050  WBC 24.0*  --  15.4* 11.5* 19.4*  --   CREATININE 1.51* 1.26* 1.02 1.31* 1.77* 1.70*    Estimated Creatinine Clearance: 33.4 mL/min (by C-G formula based on SCr of 1.7 mg/dL (H)).    Allergies  Allergen Reactions  . Lipitor [Atorvastatin]   . Lisinopril      Andrey Cota. Diona Foley, PharmD, BCPS Clinical Pharmacist 10/10/2016 12:13 PM

## 2016-10-10 NOTE — H&P (Signed)
History and Physical    Stancil Coelho [id number] DOB: [birth year] DOA: 10/10/2016  PCP: Perrin Maltese, MD Patient coming from: facility  Chief Complaint: slurred speech  HPI: Robert Rojas is a 83 y.o. male with medical history significant for CAD, CVA, hypertension, hyperlipidemia, MI, diabetes, presents to the emergency department with complaint of slurred speech. Initial evaluation concerning for TIA/stroke and/or possible pneumonia.  Formation is obtained from the chart and the patient noting that information from patient may be unreliable as it history of mild dementia. Chart review indicates he was sent to the emergency department by the facility today when he appeared to be generally weak and have some slurred speech. It is unclear when he was last normal. Patient denies any headache visual disturbances numbness tingling of extremities. He does endorse on "ache" all over. He denies any chest pain palpitation shortness of breath lower extremity edema. He denies abdominal pain nausea vomiting diarrhea constipation melena.    ED Course: In the emergency department he is afebrile hemodynamically stable and not hypoxic. Stroke was called and He is evaluated by neurology recommend MRI of the brain MRA of the head and neck and if negative evaluate further for underlying infection versus plate stroke workup  Review of Systems: As per HPI otherwise 10 point review of systems negative.   Ambulatory Status:  walker  Past Medical History:  Diagnosis Date  . Arthralgia    left knee/patella/tibia/fibula  . BCC (basal cell carcinoma)   . Coronary artery disease   . CVA (cerebral infarction)    left sided  . Dysarthria   . Erectile dysfunction   . Essential hypertension   . GERD (gastroesophageal reflux disease)   . Hyperlipemia   . MI (myocardial infarction) 01/2011  . Prostatitis   . Spinal stenosis   . TIA (transient ischemic attack)   . Type 2 diabetes mellitus ([place])     Past  Surgical History:  Procedure Laterality Date  . CORONARY ANGIOPLASTY WITH STENT PLACEMENT  2007  . KNEE SURGERY Left   . LUMBAR LAMINECTOMY      Social History   Social History  . Marital status: Married    Spouse name: N/A  . Number of children: N/A  . Years of education: N/A   Occupational History  . Not on file.   Social History Main Topics  . Smoking status: Never Smoker  . Smokeless tobacco: Never Used  . Alcohol use No  . Drug use: No  . Sexual activity: Not on file   Other Topics Concern  . Not on file   Social History Narrative  . No narrative on file    Allergies  Allergen Reactions  . Lipitor [Atorvastatin]     Unknown   . Lisinopril     unknown    Family History  Problem Relation Age of Onset  . Diabetes Mother   . Heart disease Mother   . Heart disease Father   . Kidney disease Neg Hx   . Prostate cancer Neg Hx     Prior to Admission medications   Medication Sig Start Date End Date Taking? Authorizing Provider  aspirin 81 MG tablet Take 81 mg by mouth daily.   Yes Historical Provider, MD  carvedilol (COREG) 6.25 MG tablet Take 6.25 mg by mouth 2 (two) times daily with a meal.  01/27/14  Yes Historical Provider, MD  cetirizine (ZYRTEC) 10 MG tablet TAKE 1 TABLET BY MOUTH AT BEDTIME FOR NASAL DRIP 03/31/15  Yes Historical Provider,  MD  digoxin (LANOXIN) 0.25 MG tablet Take 0.25 mg by mouth daily.   Yes Historical Provider, MD  diltiazem (CARDIZEM CD) 120 MG 24 hr capsule Take 120 mg by mouth daily.  12/30/13  Yes Historical Provider, MD  furosemide (LASIX) 40 MG tablet Take 40 mg by mouth daily.   Yes Historical Provider, MD  HUMALOG KWIKPEN 100 UNIT/ML KiwkPen USE AS DIRECTED FOR SLIDING SCALE FOUR TIMES DAILY. MAX OF 48 UNITS PER DAY 09/11/15  Yes Historical Provider, MD  JANUVIA 100 MG tablet Take 100 mg by mouth daily. 09/04/15  Yes Historical Provider, MD  LANTUS SOLOSTAR 100 UNIT/ML Solostar Pen Inject 40 Units into the skin daily.  03/23/15  Yes  Historical Provider, MD  potassium chloride SA (KLOR-CON M20) 20 MEQ tablet Take 20 mEq by mouth daily.   Yes Historical Provider, MD  rosuvastatin (CRESTOR) 40 MG tablet Take 40 mg by mouth at bedtime.   Yes Historical Provider, MD  XARELTO 15 MG TABS tablet Take 1 tablet by mouth daily. 11/18/15  Yes Historical Provider, MD  lisinopril (PRINIVIL,ZESTRIL) 2.5 MG tablet Take 2.5 mg by mouth daily.    Historical Provider, MD    Physical Exam: Vitals:   10/10/16 1126 10/10/16 1130 10/10/16 1200 10/10/16 1718  BP:  112/63 (!) 104/38 (!) 116/53  Pulse:  75 70 75  Resp:  24  18  Temp:    98.3 F (36.8 C)  TempSrc:      SpO2:  96% 95% 100%  Weight: 81.3 kg (179 lb 3.7 oz)     Height: 5\' 10"  (1.778 m)        General:  Appears calm and comfortable Eyes:  PERRL, EOMI, normal lids, iris ENT:  grossly normal hearing, lips & tongue, Mucous membranes of his mouth are pink but very dry Neck:  no LAD, masses or thyromegaly Cardiovascular:  RRR, no m/r/g. No LE edema.  Respiratory:  Rest sounds somewhat diminished throughout. I hear no crackles some faint end expiratory wheezing Normal respiratory effort. Is a very poor cough effort Abdomen:  soft, ntnd, positive bowel sounds no guarding or rebounding Skin:  no rash or induration seen on limited exam Musculoskeletal:  grossly normal tone BUE/BLE, good ROM, no bony abnormality Psychiatric:  grossly normal mood and affect, speech fluent and appropriate, AOx3 Neurologic:  Alert oriented to self and place. Speech somewhat dysarthric will follow commands and make once and needs known. Right grip 5 out of 5 left grip 4 out of 5 lower extremity strength 5 out of 5 bilaterally  Labs on Admission: I have personally reviewed following labs and imaging studies  CBC:  Recent Labs Lab 10/04/16 0953 10/05/16 0401 10/07/16 0451 10/10/16 1040 10/10/16 1050  WBC 24.0* 15.4* 11.5* 19.4*  --   NEUTROABS  --   --   --  15.7*  --   HGB 14.3 12.5* 12.4* 12.5*  12.2*  HCT 40.0 36.0* 35.2* 37.0* 36.0*  MCV 88.7 88.0 88.9 90.9  --   PLT 267 206 216 283  --    Basic Metabolic Panel:  Recent Labs Lab 10/04/16 0953 10/04/16 1537 10/05/16 0401 10/07/16 0451 10/10/16 1040 10/10/16 1050  NA 136  --  137 139 142 142  K 4.1  --  3.4* 3.6 3.9 3.8  CL 99*  --  105 106 106 103  CO2 28  --  26 26 27   --   GLUCOSE 154*  --  77 106* 250* 247*  BUN 16  --  16 23* 32* 32*  CREATININE 1.51* 1.26* 1.02 1.31* 1.77* 1.70*  CALCIUM 8.7*  --  8.1* 8.4* 8.7*  --    GFR: Estimated Creatinine Clearance: 33.4 mL/min (by C-G formula based on SCr of 1.7 mg/dL (H)). Liver Function Tests:  Recent Labs Lab 10/10/16 1040  AST 47*  ALT 45  ALKPHOS 67  BILITOT 1.1  PROT 6.8  ALBUMIN 2.7*   No results for input(s): LIPASE, AMYLASE in the last 168 hours. No results for input(s): AMMONIA in the last 168 hours. Coagulation Profile:  Recent Labs Lab 10/10/16 1040  INR 1.70   Cardiac Enzymes: No results for input(s): CKTOTAL, CKMB, CKMBINDEX, TROPONINI in the last 168 hours. BNP (last 3 results) No results for input(s): PROBNP in the last 8760 hours. HbA1C: No results for input(s): HGBA1C in the last 72 hours. CBG:  Recent Labs Lab 10/06/16 1642 10/06/16 2047 10/07/16 0723 10/07/16 1134 10/10/16 1110  GLUCAP 188* 109* 97 251* 242*   Lipid Profile: No results for input(s): CHOL, HDL, LDLCALC, TRIG, CHOLHDL, LDLDIRECT in the last 72 hours. Thyroid Function Tests: No results for input(s): TSH, T4TOTAL, FREET4, T3FREE, THYROIDAB in the last 72 hours. Anemia Panel: No results for input(s): VITAMINB12, FOLATE, FERRITIN, TIBC, IRON, RETICCTPCT in the last 72 hours. Urine analysis:    Component Value Date/Time   COLORURINE YELLOW (A) 10/04/2016 1054   APPEARANCEUR HAZY (A) 10/04/2016 1054   APPEARANCEUR Clear 06/25/2013 0228   LABSPEC 1.016 10/04/2016 1054   LABSPEC 1.013 06/25/2013 0228   PHURINE 5.0 10/04/2016 1054   GLUCOSEU NEGATIVE  10/04/2016 1054   GLUCOSEU >=500 06/25/2013 0228   HGBUR SMALL (A) 10/04/2016 1054   BILIRUBINUR NEGATIVE 10/04/2016 1054   BILIRUBINUR Negative 06/25/2013 0228   KETONESUR NEGATIVE 10/04/2016 1054   PROTEINUR 30 (A) 10/04/2016 1054   NITRITE NEGATIVE 10/04/2016 1054   LEUKOCYTESUR NEGATIVE 10/04/2016 1054   LEUKOCYTESUR Negative 06/25/2013 0228    Creatinine Clearance: Estimated Creatinine Clearance: 33.4 mL/min (by C-G formula based on SCr of 1.7 mg/dL (H)).  Sepsis Labs: @LABRCNTIP (procalcitonin:4,lacticidven:4) ) Recent Results (from the past 240 hour(s))  Blood culture (routine x 2)     Status: None   Collection Time: 10/04/16  1:06 PM  Result Value Ref Range Status   Specimen Description BLOOD L AC  Final   Special Requests   Final    BOTTLES DRAWN AEROBIC AND ANAEROBIC AER 13ML ANA 16ML   Culture NO GROWTH 5 DAYS  Final   Report Status 10/09/2016 FINAL  Final  Blood culture (routine x 2)     Status: None   Collection Time: 10/04/16  1:06 PM  Result Value Ref Range Status   Specimen Description BLOOD L FA  Final   Special Requests   Final    BOTTLES DRAWN AEROBIC AND ANAEROBIC AER 14ML ANA 15ML   Culture NO GROWTH 5 DAYS  Final   Report Status 10/09/2016 FINAL  Final     Radiological Exams on Admission: Mr Virgel Paling [id number] Contrast  Result Date: 10/10/2016 CLINICAL DATA:  Acute presentation with left-sided weakness and slurred speech. EXAM: MRI HEAD WITHOUT AND WITH CONTRAST MRA HEAD WITHOUT CONTRAST MRA NECK WITHOUT AND WITH CONTRAST TECHNIQUE: Multiplanar, multiecho pulse sequences of the brain and surrounding structures were obtained without and with intravenous contrast. Angiographic images of the Circle of Willis were obtained using MRA technique without intravenous contrast. Angiographic images of the neck were obtained using MRA technique without and with intravenous contrast. Carotid stenosis measurements (when  applicable) are obtained utilizing NASCET criteria, using  the distal internal carotid diameter as the denominator. CONTRAST:  10 cc MultiHance COMPARISON:  Head CT same day FINDINGS: MRI HEAD FINDINGS Brain: Diffusion imaging does not show any acute or subacute infarction. There chronic small-vessel ischemic changes affecting the pons. There is chronic cerebellar atrophy. Cerebral hemispheres show chronic widespread atrophy with chronic small-vessel ischemic changes throughout the deep white matter. Old infarction in the basal gangliar regions more extensive on the right than the left. No large vessel territory infarction. No evidence of neoplastic mass lesion, acute hemorrhage, hydrocephalus or extra-axial collection. Residual blood products in the region of right basal ganglia infarction. After contrast administration, no abnormal enhancement occurs. Vascular: Major vessels at the base of the brain show flow,, with the exception of abnormal appearing flow in the right vertebral artery. Skull and upper cervical spine: Negative Sinuses/Orbits: Mild mucosal inflammation of the sinuses. Small mastoid effusions. Other: None significant MRA HEAD FINDINGS Both internal carotid arteries are widely patent through the skullbase. The anterior and middle cerebral vessels are patent bilaterally, but there are serial stenoses which could place the patient at risk of MCA infarction on either side. This appears slightly more pronounced on the right or serial stenoses are in the region of 50-70%. Stenoses in the left appear more in the region of 30-50%. Left vertebral artery is patent. Atherosclerotic irregularity of the distal vertebral artery with stenosis estimated at 30-50%. Right vertebral artery is a small vessel which is either occluded or shows minimal flow. See neck exam. The basilar artery shows atherosclerotic irregularity and narrowing. Distal basilar stenosis is estimated at 50-70%. Flow is present within both superior cerebellar arteries. Limited if any left PCA flow is  demonstrated. MRA NECK FINDINGS Image volume unfortunately excludes the left subclavian artery proximally as well as excludes portion of the proximal left vertebral. Common origin of the innominate artery and left common carotid artery is widely patent. Right common carotid artery widely patent to the bifurcation. Mild atherosclerotic disease in the right ICA bulb, but no stenosis. Cervical ICA widely patent. Left common carotid artery widely patent to the bifurcation. Carotid bifurcation region widely patent. Cervical ICA widely patent. Right vertebral artery origin difficult to evaluate precisely because of small size. This is a small vessel which shows antegrade flow, probably with a small component reaching the basilar. Left vertebral artery origin is patent. Tortuous vessel beyond that leaves the imaging volume, but I presume it is widely patent. Beyond that, the left vertebral artery is widely patent through the cervical region. IMPRESSION: No acute infarction by MRI. Extensive old small vessel ischemic changes throughout the brain including old bilateral basal ganglia strokes, larger on the right than the left. Neck MRA. No significant carotid bifurcation disease. Left subclavian artery is not included in the imaging volume. Left vertebral artery origin is patent and the vessel appears to be widely patent through the neck, though a portion of the proximal vertebral artery also leaves the imaging volume. Right vertebral artery is a small diseased vessel, but probably does give some supply to the basilar. Intracranial MRA. Serial stenoses in both the MCA territories, more severe on the right than the left. Stenoses on the right are probably in the 50-70% range and on the left probably in the 30-50% range. Nonetheless, the patient would be at risk of MCA infarction. Severe atherosclerotic disease in the posterior circulation. Both distal vertebral arteries show narrowing and irregularity. The basilar artery  shows severe atherosclerotic narrowing  and irregularity including a severe distal stenosis. Diminished flow is demonstrated in the left PCA territory. Electronically Signed   By: Nelson Chimes M.D.   On: 10/10/2016 14:50   Mr Jodene Nam Neck W Wo Contrast  Result Date: 10/10/2016 CLINICAL DATA:  Acute presentation with left-sided weakness and slurred speech. EXAM: MRI HEAD WITHOUT AND WITH CONTRAST MRA HEAD WITHOUT CONTRAST MRA NECK WITHOUT AND WITH CONTRAST TECHNIQUE: Multiplanar, multiecho pulse sequences of the brain and surrounding structures were obtained without and with intravenous contrast. Angiographic images of the Circle of Willis were obtained using MRA technique without intravenous contrast. Angiographic images of the neck were obtained using MRA technique without and with intravenous contrast. Carotid stenosis measurements (when applicable) are obtained utilizing NASCET criteria, using the distal internal carotid diameter as the denominator. CONTRAST:  10 cc MultiHance COMPARISON:  Head CT same day FINDINGS: MRI HEAD FINDINGS Brain: Diffusion imaging does not show any acute or subacute infarction. There chronic small-vessel ischemic changes affecting the pons. There is chronic cerebellar atrophy. Cerebral hemispheres show chronic widespread atrophy with chronic small-vessel ischemic changes throughout the deep white matter. Old infarction in the basal gangliar regions more extensive on the right than the left. No large vessel territory infarction. No evidence of neoplastic mass lesion, acute hemorrhage, hydrocephalus or extra-axial collection. Residual blood products in the region of right basal ganglia infarction. After contrast administration, no abnormal enhancement occurs. Vascular: Major vessels at the base of the brain show flow,, with the exception of abnormal appearing flow in the right vertebral artery. Skull and upper cervical spine: Negative Sinuses/Orbits: Mild mucosal inflammation of the  sinuses. Small mastoid effusions. Other: None significant MRA HEAD FINDINGS Both internal carotid arteries are widely patent through the skullbase. The anterior and middle cerebral vessels are patent bilaterally, but there are serial stenoses which could place the patient at risk of MCA infarction on either side. This appears slightly more pronounced on the right or serial stenoses are in the region of 50-70%. Stenoses in the left appear more in the region of 30-50%. Left vertebral artery is patent. Atherosclerotic irregularity of the distal vertebral artery with stenosis estimated at 30-50%. Right vertebral artery is a small vessel which is either occluded or shows minimal flow. See neck exam. The basilar artery shows atherosclerotic irregularity and narrowing. Distal basilar stenosis is estimated at 50-70%. Flow is present within both superior cerebellar arteries. Limited if any left PCA flow is demonstrated. MRA NECK FINDINGS Image volume unfortunately excludes the left subclavian artery proximally as well as excludes portion of the proximal left vertebral. Common origin of the innominate artery and left common carotid artery is widely patent. Right common carotid artery widely patent to the bifurcation. Mild atherosclerotic disease in the right ICA bulb, but no stenosis. Cervical ICA widely patent. Left common carotid artery widely patent to the bifurcation. Carotid bifurcation region widely patent. Cervical ICA widely patent. Right vertebral artery origin difficult to evaluate precisely because of small size. This is a small vessel which shows antegrade flow, probably with a small component reaching the basilar. Left vertebral artery origin is patent. Tortuous vessel beyond that leaves the imaging volume, but I presume it is widely patent. Beyond that, the left vertebral artery is widely patent through the cervical region. IMPRESSION: No acute infarction by MRI. Extensive old small vessel ischemic changes  throughout the brain including old bilateral basal ganglia strokes, larger on the right than the left. Neck MRA. No significant carotid bifurcation disease. Left subclavian artery is  not included in the imaging volume. Left vertebral artery origin is patent and the vessel appears to be widely patent through the neck, though a portion of the proximal vertebral artery also leaves the imaging volume. Right vertebral artery is a small diseased vessel, but probably does give some supply to the basilar. Intracranial MRA. Serial stenoses in both the MCA territories, more severe on the right than the left. Stenoses on the right are probably in the 50-70% range and on the left probably in the 30-50% range. Nonetheless, the patient would be at risk of MCA infarction. Severe atherosclerotic disease in the posterior circulation. Both distal vertebral arteries show narrowing and irregularity. The basilar artery shows severe atherosclerotic narrowing and irregularity including a severe distal stenosis. Diminished flow is demonstrated in the left PCA territory. Electronically Signed   By: Nelson Chimes M.D.   On: 10/10/2016 14:50   Mr Jeri Cos [id number] Contrast  Result Date: 10/10/2016 CLINICAL DATA:  Acute presentation with left-sided weakness and slurred speech. EXAM: MRI HEAD WITHOUT AND WITH CONTRAST MRA HEAD WITHOUT CONTRAST MRA NECK WITHOUT AND WITH CONTRAST TECHNIQUE: Multiplanar, multiecho pulse sequences of the brain and surrounding structures were obtained without and with intravenous contrast. Angiographic images of the Circle of Willis were obtained using MRA technique without intravenous contrast. Angiographic images of the neck were obtained using MRA technique without and with intravenous contrast. Carotid stenosis measurements (when applicable) are obtained utilizing NASCET criteria, using the distal internal carotid diameter as the denominator. CONTRAST:  10 cc MultiHance COMPARISON:  Head CT same day FINDINGS: MRI  HEAD FINDINGS Brain: Diffusion imaging does not show any acute or subacute infarction. There chronic small-vessel ischemic changes affecting the pons. There is chronic cerebellar atrophy. Cerebral hemispheres show chronic widespread atrophy with chronic small-vessel ischemic changes throughout the deep white matter. Old infarction in the basal gangliar regions more extensive on the right than the left. No large vessel territory infarction. No evidence of neoplastic mass lesion, acute hemorrhage, hydrocephalus or extra-axial collection. Residual blood products in the region of right basal ganglia infarction. After contrast administration, no abnormal enhancement occurs. Vascular: Major vessels at the base of the brain show flow,, with the exception of abnormal appearing flow in the right vertebral artery. Skull and upper cervical spine: Negative Sinuses/Orbits: Mild mucosal inflammation of the sinuses. Small mastoid effusions. Other: None significant MRA HEAD FINDINGS Both internal carotid arteries are widely patent through the skullbase. The anterior and middle cerebral vessels are patent bilaterally, but there are serial stenoses which could place the patient at risk of MCA infarction on either side. This appears slightly more pronounced on the right or serial stenoses are in the region of 50-70%. Stenoses in the left appear more in the region of 30-50%. Left vertebral artery is patent. Atherosclerotic irregularity of the distal vertebral artery with stenosis estimated at 30-50%. Right vertebral artery is a small vessel which is either occluded or shows minimal flow. See neck exam. The basilar artery shows atherosclerotic irregularity and narrowing. Distal basilar stenosis is estimated at 50-70%. Flow is present within both superior cerebellar arteries. Limited if any left PCA flow is demonstrated. MRA NECK FINDINGS Image volume unfortunately excludes the left subclavian artery proximally as well as excludes  portion of the proximal left vertebral. Common origin of the innominate artery and left common carotid artery is widely patent. Right common carotid artery widely patent to the bifurcation. Mild atherosclerotic disease in the right ICA bulb, but no stenosis. Cervical ICA widely patent.  Left common carotid artery widely patent to the bifurcation. Carotid bifurcation region widely patent. Cervical ICA widely patent. Right vertebral artery origin difficult to evaluate precisely because of small size. This is a small vessel which shows antegrade flow, probably with a small component reaching the basilar. Left vertebral artery origin is patent. Tortuous vessel beyond that leaves the imaging volume, but I presume it is widely patent. Beyond that, the left vertebral artery is widely patent through the cervical region. IMPRESSION: No acute infarction by MRI. Extensive old small vessel ischemic changes throughout the brain including old bilateral basal ganglia strokes, larger on the right than the left. Neck MRA. No significant carotid bifurcation disease. Left subclavian artery is not included in the imaging volume. Left vertebral artery origin is patent and the vessel appears to be widely patent through the neck, though a portion of the proximal vertebral artery also leaves the imaging volume. Right vertebral artery is a small diseased vessel, but probably does give some supply to the basilar. Intracranial MRA. Serial stenoses in both the MCA territories, more severe on the right than the left. Stenoses on the right are probably in the 50-70% range and on the left probably in the 30-50% range. Nonetheless, the patient would be at risk of MCA infarction. Severe atherosclerotic disease in the posterior circulation. Both distal vertebral arteries show narrowing and irregularity. The basilar artery shows severe atherosclerotic narrowing and irregularity including a severe distal stenosis. Diminished flow is demonstrated in the  left PCA territory. Electronically Signed   By: Nelson Chimes M.D.   On: 10/10/2016 14:50   Dg Chest Port 1 View  Result Date: 10/10/2016 CLINICAL DATA:  Cough, code stroke. EXAM: PORTABLE CHEST 1 VIEW COMPARISON:  10/04/2016 FINDINGS: Normal cardiac silhouette. There is mild peribronchial thickening centrally. No focal infiltrate. No effusion or edema. No pneumothorax. IMPRESSION: Coarsening of the peribronchial markings centrally may represent bronchitis. No pneumonia identified. Electronically Signed   By: Suzy Bouchard M.D.   On: 10/10/2016 16:37   Ct Head Code Stroke W/o Cm  Result Date: 10/10/2016 CLINICAL DATA:  Code stroke. Slurred speech and left-sided weakness. EXAM: CT HEAD WITHOUT CONTRAST TECHNIQUE: Contiguous axial images were obtained from the base of the skull through the vertex without intravenous contrast. COMPARISON:  07/27/2015 FINDINGS: Brain: No acute infarct is noted. There is asymmetric cortical atrophy and white matter gliosis in the right cerebral hemisphere that has progressed. Remote basal ganglia infarct on the right. Background of generalized atrophy, especially prominent in the posterior fossa. No hemorrhage or hydrocephalus.  Negative for mass. Vascular: Atherosclerotic calcifications. Possible hyperdense right M2 branch, although this could also be atherosclerotic plaque. Skull: No acute finding Sinuses/Orbits: No acute finding Other: Page sent at the time of interpretation on 10/10/2016 at 11:09 am to Dr. Leonel Ramsay. ASPECTS [facility] Stroke Program Early CT Score) Not scored due to degree of chronic change IMPRESSION: 1. No acute hemorrhage or visible acute infarct. 2. Atherosclerotic calcification versus hyperdense right M2 branch. 3. Remote right basal ganglia infarct. Asymmetric atrophy and white matter gliosis in the right cerebral hemisphere which can indicate chronic ischemia and arterial stenosis. 4. Generalized atrophy, most prominent in the posterior fossa.  Electronically Signed   By: Monte Fantasia M.D.   On: 10/10/2016 11:13    EKG: Independently reviewed. Atrial fibrillationNonspecific intraventricular conduction delay `marked st dep/t inversion v2-v5  Assessment/Plan Principal Problem:   Stroke-like symptoms Active Problems:   Pneumonia   Weakness   Elevated troponin   Abnormal EKG  Essential hypertension   Coronary artery disease   Acute kidney injury ([place])   Stenosis of middle cerebral artery   Stroke-like symptom   Dysarthria   #1. Strokelike symptoms. Facility reported worsening slurred speech with generalized weakness. History of CVA residual left-sided weakness. Risk factors include diabetes, CAD, hypertension afib. CT of the head no acute bleed or stroke. MRI of the brain MRA of the head and neck acute hemorrhage or visible infarct. Does reveal MCA stenosis. Evaluated by neurology who opined likely related to infectious process. Recommended if MRI and MRA negative further evaluation for underlying infection and/or other physiological stressor versus complete stroke workup -Admit to telemetry -Speech therapy patient with a history of dysarthria failed bedside swallow eval -Lipid panel -Continue aspirin -physical therapy and occupational therapy  #2. Pneumonia. Patient with history of same.chest xray with bronchitis, leukocytosis, exam with congestion. Hemodynamically stable lactic acid within the limits of normal. Oxygen saturation level greater than 90 on 2 L. Of note leukocytosis 19.4 which is down from last week but trending up from 3 days ago. He is nontoxic appearing -Follow blood cultures -antibiotics per protocol -Sputum culture -strep pneumo urine antigen -influenza panel -IV fluids -check WBC in am -Blood pressure is a little  on the soft side of normal. Will hold coreg, cardizem, lasix  #3. Elevated troponin/abnormal EKG. Hx cad. Home meds include aspirin, Coreg, Lanoxin, Cardizem, Crestor Patient denies any  chest pain. Initial troponin 0.46. -Monitor on telemetry -cycle troponin -serial ekg -Continue statin -cardiology consult request  4. A. Fib.Mali score 4. On xarelto. ekg as noted above. Home meds include coreg, dig, diltiazem. HR 70's on admission -resume home meds when indicated  #5. Diabetes. Serum glucose 150 on admission. Home medications include Lantus 40 units as well as Januvia and sliding scale. - Will hold oral agents for now - will continue Lantus -Obtain a hemoglobin A1c -A scale insulin for optimal control  #6. Acute kidney injury. Likely related to decreased oral intake in the setting of above. Creatinine 1.7 on admission chart review indicates baseline closer to 1.2 -Hold nephrotoxins -gentle IV fluids -Monitor urine output -Recheck in the morning  #7. Dysarthria/left-sided weakness. This is residual from previous stroke. Patient's last dosing baseline. -Nothing by mouth until evaluated by speech therapy -Physical therapy -Occupational therapy   DVT prophylaxis: xarelto  Code Status: dnr  Family Communication: none present  Disposition Plan: back to facility  Consults called: cardiology per cardmaster, dr kirpatrick with neuro  Admission status: inpatient    Radene Gunning MD [facility]  If 7PM-7AM, please contact night-coverage [email or web address] Password [email or web address]  10/10/2016, 5:38 PM

## 2016-10-10 NOTE — ED Notes (Signed)
Daughter's number Marcelene [place]) [phone]

## 2016-10-10 NOTE — ED Triage Notes (Signed)
Patient comes in [employer] with left side weakness and slurred speech. LSW at 800. Patient is a/ox2. Slurred speech noted.

## 2016-10-10 NOTE — Progress Notes (Addendum)
Received from ED via stretcher; patient is alert to self; disoriented to time and place; following commands appropriately; NIH 1; oriented to room and unit routine; cleansed his bottom; applied condom cath. Fall safety measures in place; bed low and locked; bed alarm on.

## 2016-10-10 NOTE — Consult Note (Signed)
Requesting Physician: ED MD    Chief Complaint: left sided weakness and slurred speech  History obtained from:  Patient   And nursing home  HPI:                                                                                                                                         Robert Rojas is an 83 y.o. male with previous stroke and previous left sided weakness. Today he was normal at 0800 ours and then at 0930 his family noted increased left sided weakness and slurred speech. HE was brought to [facility] where he was dysarthric and had increase creatinine. CT head showed strophy with no acute bleed or stroke. Currently patient is complaiing of left sided decreased sensation and dysarthria. He also is exhibiting hiccups.   Of note patient's BE ON was 32 and creatinine was 1.7..  His white blood cell count was 11.5  Of note looking back at his trend with his white blood cell count 3 days ago he had what was a count of 24 which is decreased down to 11. I question if there is an underlying infection  Date last known well: Date: 10/10/2016 Time last known well: Time: 08:00 tPA Given: No: on Xeralto   Past Medical History:  Diagnosis Date  . Arthralgia    left knee/patella/tibia/fibula  . BCC (basal cell carcinoma)   . Coronary artery disease   . CVA (cerebral infarction)    left sided  . Erectile dysfunction   . Essential hypertension   . GERD (gastroesophageal reflux disease)   . Hyperlipemia   . MI (myocardial infarction) 01/2011  . Prostatitis   . Spinal stenosis   . TIA (transient ischemic attack)   . Type 2 diabetes mellitus ([place])     Past Surgical History:  Procedure Laterality Date  . CORONARY ANGIOPLASTY WITH STENT PLACEMENT  2007  . KNEE SURGERY Left   . LUMBAR LAMINECTOMY      Family History  Problem Relation Age of Onset  . Diabetes Mother   . Heart disease Mother   . Heart disease Father   . Kidney disease Neg Hx   . Prostate cancer Neg Hx    Social History:   reports that he has never smoked. He has never used smokeless tobacco. He reports that he does not drink alcohol or use drugs.  Allergies:  Allergies  Allergen Reactions  . Lipitor [Atorvastatin]   . Lisinopril     Medications:  Current Facility-Administered Medications  Medication Dose Route Frequency Provider Last Rate Last Dose  . iopamidol (ISOVUE-370) 76 % injection            Current Outpatient Prescriptions  Medication Sig Dispense Refill  . aspirin 81 MG tablet Take 81 mg by mouth daily.    . carvedilol (COREG) 6.25 MG tablet Take 6.25 mg by mouth 2 (two) times daily with a meal.     . cetirizine (ZYRTEC) 10 MG tablet TAKE 1 TABLET BY MOUTH AT BEDTIME FOR NASAL DRIP  5  . digoxin (LANOXIN) 0.25 MG tablet Take 0.25 mg by mouth daily.    Marland Kitchen diltiazem (CARDIZEM CD) 120 MG 24 hr capsule Take 120 mg by mouth daily.     . furosemide (LASIX) 40 MG tablet Take 40 mg by mouth daily.    Marland Kitchen HUMALOG KWIKPEN 100 UNIT/ML KiwkPen USE AS DIRECTED FOR SLIDING SCALE FOUR TIMES DAILY. MAX OF 48 UNITS PER DAY  3  . JANUVIA 100 MG tablet Take 100 mg by mouth daily.  5  . LANTUS SOLOSTAR 100 UNIT/ML Solostar Pen Inject 40 Units into the skin daily.     Marland Kitchen lisinopril (PRINIVIL,ZESTRIL) 2.5 MG tablet Take 2.5 mg by mouth daily.    . potassium chloride SA (KLOR-CON M20) 20 MEQ tablet Take 20 mEq by mouth daily.    . rosuvastatin (CRESTOR) 40 MG tablet Take 40 mg by mouth at bedtime.    Alveda Reasons 15 MG TABS tablet Take 1 tablet by mouth daily.  2     ROS:                                                                                                                                       History obtained from chart review  General ROS: negative for - chills, fatigue, fever, night sweats, weight gain or weight loss Psychological ROS: negative for - behavioral disorder,  hallucinations, memory difficulties, mood swings or suicidal ideation Ophthalmic ROS: negative for - blurry vision, double vision, eye pain or loss of vision ENT ROS: negative for - epistaxis, nasal discharge, oral lesions, sore throat, tinnitus or vertigo Allergy and Immunology ROS: negative for - hives or itchy/watery eyes Hematological and Lymphatic ROS: negative for - bleeding problems, bruising or swollen lymph nodes Endocrine ROS: negative for - galactorrhea, hair pattern changes, polydipsia/polyuria or temperature intolerance Respiratory ROS: negative for - cough, hemoptysis, shortness of breath or wheezing Cardiovascular ROS: negative for - chest pain, dyspnea on exertion, edema or irregular heartbeat Gastrointestinal ROS: negative for - abdominal pain, diarrhea, hematemesis, nausea/vomiting or stool incontinence Genito-Urinary ROS: negative for - dysuria, hematuria, incontinence or urinary frequency/urgency Musculoskeletal ROS: negative for - joint swelling or muscular weakness Neurological ROS: as noted in HPI Dermatological ROS: negative for rash and skin lesion changes  Neurologic Examination:  There were no vitals taken for this visit.  HEENT-  Normocephalic, no lesions, without obvious abnormality.  Normal external eye and conjunctiva.  Normal TM's bilaterally.  Normal auditory canals and external ears. Normal external nose, mucus membranes and septum.  Normal pharynx. Cardiovascular- S1, S2 normal, pulses palpable throughout   Lungs- chest clear, no wheezing, rales, normal symmetric air entry, Heart exam - S1, S2 normal, no murmur, no gallop, rate regular Abdomen- normal findings: bowel sounds normal Extremities- no edema Lymph-no adenopathy palpable Musculoskeletal-no joint tenderness, deformity or swelling Skin-warm and dry, no hyperpigmentation, vitiligo, or suspicious  lesions  Neurological Examination Mental Status: Alert, oriented to hospital.  Speech dysarthric and hard to understand at times without evidence of aphasia.  Able to follow 3 step commands without difficulty. Cranial Nerves: II:  Visual fields grossly normal,  III,IV, VI: ptosis not present, extra-ocular motions intact bilaterally, pupils equal, round, reactive to light and accommodation V,VII: smile symmetric, facial light touch sensation normal bilaterally VIII: hearing normal bilaterally IX,X: uvula rises symmetrically XI: bilateral shoulder shrug XII: midline tongue extension Motor: Right : Upper extremity   5/5    Left:     Upper extremity   4/5  Lower extremity   5/5     Lower extremity   5/5 Tone and bulk:normal tone throughout; no atrophy noted Sensory: Pinprick and light touch decreased on the left arm and leg Deep Tendon Reflexes: 1+ and symmetric throughout upper extremities with no knee jerk or ankle jerk Plantars: Mute bilaterally Cerebellar: normal finger-to-nose, n Gait: Not tested       Lab Results: Basic Metabolic Panel:  Recent Labs Lab 10/04/16 0953 10/04/16 1537 10/05/16 0401 10/07/16 0451  NA 136  --  137 139  K 4.1  --  3.4* 3.6  CL 99*  --  105 106  CO2 28  --  26 26  GLUCOSE 154*  --  77 106*  BUN 16  --  16 23*  CREATININE 1.51* 1.26* 1.02 1.31*  CALCIUM 8.7*  --  8.1* 8.4*    Liver Function Tests: No results for input(s): AST, ALT, ALKPHOS, BILITOT, PROT, ALBUMIN in the last 168 hours. No results for input(s): LIPASE, AMYLASE in the last 168 hours. No results for input(s): AMMONIA in the last 168 hours.  CBC:  Recent Labs Lab 10/04/16 0953 10/05/16 0401 10/07/16 0451  WBC 24.0* 15.4* 11.5*  HGB 14.3 12.5* 12.4*  HCT 40.0 36.0* 35.2*  MCV 88.7 88.0 88.9  PLT 267 206 216    Cardiac Enzymes: No results for input(s): CKTOTAL, CKMB, CKMBINDEX, TROPONINI in the last 168 hours.  Lipid Panel: No results for input(s): CHOL,  TRIG, HDL, CHOLHDL, VLDL, LDLCALC in the last 168 hours.  CBG:  Recent Labs Lab 10/06/16 1128 10/06/16 1642 10/06/16 2047 10/07/16 0723 10/07/16 1134  GLUCAP 223* 188* 109* 97 251*    Microbiology: Results for orders placed or performed during the hospital encounter of 10/04/16  Blood culture (routine x 2)     Status: None   Collection Time: 10/04/16  1:06 PM  Result Value Ref Range Status   Specimen Description BLOOD L AC  Final   Special Requests   Final    BOTTLES DRAWN AEROBIC AND ANAEROBIC AER 13ML ANA 16ML   Culture NO GROWTH 5 DAYS  Final   Report Status 10/09/2016 FINAL  Final  Blood culture (routine x 2)     Status: None   Collection Time: 10/04/16  1:06 PM  Result  Value Ref Range Status   Specimen Description BLOOD L FA  Final   Special Requests   Final    BOTTLES DRAWN AEROBIC AND ANAEROBIC AER 14ML ANA 15ML   Culture NO GROWTH 5 DAYS  Final   Report Status 10/09/2016 FINAL  Final    Coagulation Studies: No results for input(s): LABPROT, INR in the last 72 hours.  Imaging: No results found.     Assessment and plan discussed with with attending physician and they are in agreement.    Etta Quill PA-C [facility] [phone]  10/10/2016, 10:51 AM   Assessment: 83 y.o. male presenting to [place] ED as a code stroke secondary to increased slurred speech and left-sided weakness. At baseline patient does have left-sided weakness and mild dysarthria. As noted above patient did have an elevated leukocytosis 3 days ago at 24 which is now 33. Possibilities include chronic left hemisphere hypoperfusion with superimposed acute ischemia, recrudescence of previous stroke symptoms in the absence of acute ischemia, or novel stroke.   Stroke Risk Factors - diabetes mellitus and hypertension  Recommend 1) MRI brain without contrast to evaluate for acute stroke, MRA head and neck  If positive would further stroke workup, if negative would further  evaluate for underlying infection or other physiological stressor.   Roland Rack, MD [facility] [phone]  If 7pm- 7am, please page neurology on call as listed in Between.

## 2016-10-10 NOTE — ED Provider Notes (Addendum)
[facility] DEPT Provider Note   CSN: [id number] Arrival date & time: 10/10/16  1039   An emergency department physician performed an initial assessment on this suspected stroke patient at 1040.  History   Chief Complaint No chief complaint on file.   HPI Robert Rojas is a 83 y.o. male.  Patient with hx dementia, from ecf, s/p recent admission for resp infection, noted by staff at [facility] to appear generally weak and have slurred speech this AM.  Last seen normal/at baseline is unclear from report.  Patient with hx dementia, and is very poor historian - level 5 caveat.  Pt denies headache. Patient denies chest pain or discomfort. No fevers.    The history is provided by the patient and the EMS personnel. The history is limited by the condition of the patient.    Past Medical History:  Diagnosis Date  . Arthralgia    left knee/patella/tibia/fibula  . BCC (basal cell carcinoma)   . Coronary artery disease   . CVA (cerebral infarction)    left sided  . Erectile dysfunction   . Essential hypertension   . GERD (gastroesophageal reflux disease)   . Hyperlipemia   . MI (myocardial infarction) 01/2011  . Prostatitis   . Spinal stenosis   . TIA (transient ischemic attack)   . Type 2 diabetes mellitus [facility])     Patient Active Problem List   Diagnosis Date Noted  . Pneumonia 10/04/2016  . Syncope 07/28/2015  . Erectile dysfunction of organic origin 05/05/2015    Past Surgical History:  Procedure Laterality Date  . CORONARY ANGIOPLASTY WITH STENT PLACEMENT  2007  . KNEE SURGERY Left   . LUMBAR LAMINECTOMY         Home Medications    Prior to Admission medications   Medication Sig Start Date End Date Taking? Authorizing Provider  aspirin 81 MG tablet Take 81 mg by mouth daily.    Historical Provider, MD  carvedilol (COREG) 6.25 MG tablet Take 6.25 mg by mouth 2 (two) times daily with a meal.  01/27/14   Historical Provider, MD  cetirizine (ZYRTEC) 10 MG tablet TAKE 1  TABLET BY MOUTH AT BEDTIME FOR NASAL DRIP 03/31/15   Historical Provider, MD  digoxin (LANOXIN) 0.25 MG tablet Take 0.25 mg by mouth daily.    Historical Provider, MD  diltiazem (CARDIZEM CD) 120 MG 24 hr capsule Take 120 mg by mouth daily.  12/30/13   Historical Provider, MD  furosemide (LASIX) 40 MG tablet Take 40 mg by mouth daily.    Historical Provider, MD  HUMALOG KWIKPEN 100 UNIT/ML KiwkPen USE AS DIRECTED FOR SLIDING SCALE FOUR TIMES DAILY. MAX OF 48 UNITS PER DAY 09/11/15   Historical Provider, MD  JANUVIA 100 MG tablet Take 100 mg by mouth daily. 09/04/15   Historical Provider, MD  LANTUS SOLOSTAR 100 UNIT/ML Solostar Pen Inject 40 Units into the skin daily.  03/23/15   Historical Provider, MD  lisinopril (PRINIVIL,ZESTRIL) 2.5 MG tablet Take 2.5 mg by mouth daily.    Historical Provider, MD  potassium chloride SA (KLOR-CON M20) 20 MEQ tablet Take 20 mEq by mouth daily.    Historical Provider, MD  rosuvastatin (CRESTOR) 40 MG tablet Take 40 mg by mouth at bedtime.    Historical Provider, MD  XARELTO 15 MG TABS tablet Take 1 tablet by mouth daily. 11/18/15   Historical Provider, MD    Family History Family History  Problem Relation Age of Onset  . Diabetes Mother   .  Heart disease Mother   . Heart disease Father   . Kidney disease Neg Hx   . Prostate cancer Neg Hx     Social History Social History  Substance Use Topics  . Smoking status: Never Smoker  . Smokeless tobacco: Never Used  . Alcohol use No     Allergies   Lipitor [atorvastatin] and Lisinopril   Review of Systems Review of Systems  Unable to perform ROS: Dementia  Constitutional: Negative for fever.  Respiratory: Positive for cough. Negative for shortness of breath.   Cardiovascular: Negative for chest pain.  Neurological: Negative for headaches.  pt very poor historian, poorly compliant w ros, dementia - level 5 caveat   Physical Exam Updated Vital Signs BP 119/56 (BP Location: Right Arm)   Pulse 75    Temp 97.9 F (36.6 C) (Oral)   Resp 21   Ht 5\' 10"  (1.778 m)   Wt 81.3 kg   SpO2 97%   BMI 25.72 kg/m   Physical Exam  Constitutional: He appears well-developed and well-nourished. No distress.  HENT:  Head: Atraumatic.  Mouth/Throat: Oropharynx is clear and moist.  Eyes: Conjunctivae are normal. Pupils are equal, round, and reactive to light.  Neck: Neck supple. No tracheal deviation present.  No bruits  Cardiovascular: Normal rate, regular rhythm, normal heart sounds and intact distal pulses.   Pulmonary/Chest: Effort normal. No accessory muscle usage. No respiratory distress.  Rhonchi bil.   Abdominal: Soft. Bowel sounds are normal. He exhibits no distension. There is no tenderness.  Genitourinary:  Genitourinary Comments: No cva tenderness  Musculoskeletal: He exhibits no edema.  Neurological: He is alert.  Awake, and alert appearing.  Speech is dysarthric, difficult to understand. Moves bil extremities purposefully.   Skin: Skin is warm and dry.  Psychiatric: He has a normal mood and affect.  Nursing note and vitals reviewed.    ED Treatments / Results  Labs (all labs ordered are listed, but only abnormal results are displayed) Results for orders placed or performed during the hospital encounter of 10/10/16  APTT  Result Value Ref Range   aPTT 45 (H) 24 - 36 seconds  CBC  Result Value Ref Range   WBC 19.4 (H) 4.0 - 10.5 K/uL   RBC 4.07 (L) 4.22 - 5.81 MIL/uL   Hemoglobin 12.5 (L) 13.0 - 17.0 g/dL   HCT 37.0 (L) 39.0 - 52.0 %   MCV 90.9 78.0 - 100.0 fL   MCH 30.7 26.0 - 34.0 pg   MCHC 33.8 30.0 - 36.0 g/dL   RDW 13.8 11.5 - 15.5 %   Platelets 283 150 - 400 K/uL  Differential  Result Value Ref Range   Neutrophils Relative % PENDING %   Neutro Abs PENDING 1.7 - 7.7 K/uL   Band Neutrophils PENDING %   Lymphocytes Relative PENDING %   Lymphs Abs PENDING 0.7 - 4.0 K/uL   Monocytes Relative PENDING %   Monocytes Absolute PENDING 0.1 - 1.0 K/uL   Eosinophils  Relative PENDING %   Eosinophils Absolute PENDING 0.0 - 0.7 K/uL   Basophils Relative PENDING %   Basophils Absolute PENDING 0.0 - 0.1 K/uL   WBC Morphology PENDING    RBC Morphology PENDING    Smear Review PENDING    nRBC PENDING 0 /100 WBC   Metamyelocytes Relative PENDING %   Myelocytes PENDING %   Promyelocytes Absolute PENDING %   Blasts PENDING %  Comprehensive metabolic panel  Result Value Ref Range   Sodium  142 135 - 145 mmol/L   Potassium 3.9 3.5 - 5.1 mmol/L   Chloride 106 101 - 111 mmol/L   CO2 27 22 - 32 mmol/L   Glucose, Bld 250 (H) 65 - 99 mg/dL   BUN 32 (H) 6 - 20 mg/dL   Creatinine, Ser 1.77 (H) 0.61 - 1.24 mg/dL   Calcium 8.7 (L) 8.9 - 10.3 mg/dL   Total Protein 6.8 6.5 - 8.1 g/dL   Albumin 2.7 (L) 3.5 - 5.0 g/dL   AST 47 (H) 15 - 41 U/L   ALT 45 17 - 63 U/L   Alkaline Phosphatase 67 38 - 126 U/L   Total Bilirubin 1.1 0.3 - 1.2 mg/dL   GFR calc non Af Amer 34 (L) >60 mL/min   GFR calc Af Amer 39 (L) >60 mL/min   Anion gap 9 5 - 15  I-stat troponin, ED  Result Value Ref Range   Troponin i, poc 0.47 (HH) 0.00 - 0.08 ng/mL   Comment NOTIFIED PHYSICIAN    Comment 3          CBG monitoring, ED  Result Value Ref Range   Glucose-Capillary 242 (H) 65 - 99 mg/dL   Comment 1 Notify RN    Comment 2 Document in Chart   I-Stat Chem 8, ED  Result Value Ref Range   Sodium 142 135 - 145 mmol/L   Potassium 3.8 3.5 - 5.1 mmol/L   Chloride 103 101 - 111 mmol/L   BUN 32 (H) 6 - 20 mg/dL   Creatinine, Ser 1.70 (H) 0.61 - 1.24 mg/dL   Glucose, Bld 247 (H) 65 - 99 mg/dL   Calcium, Ion 1.10 (L) 1.15 - 1.40 mmol/L   TCO2 28 0 - 100 mmol/L   Hemoglobin 12.2 (L) 13.0 - 17.0 g/dL   HCT 36.0 (L) 39.0 - 52.0 %   Dg Chest 2 View  Result Date: 10/04/2016 CLINICAL DATA:  chest pain, weakness EXAM: CHEST  2 VIEW COMPARISON:  07/27/2015 FINDINGS: Patchy airspace opacities have developed throughout the right lung. Minimal patchy densities at the left base. Normal heart size. No  pneumothorax. No pleural effusion. Stable mid-level thoracic compression deformity. IMPRESSION: Patchy airspace opacities right greater than left worrisome for bronchopneumonia. Followup PA and lateral chest X-ray is recommended in 3-4 weeks following trial of antibiotic therapy to ensure resolution and exclude underlying malignancy. Electronically Signed   By: Marybelle Killings M.D.   On: 10/04/2016 12:03   Ct Head Code Stroke W/o Cm  Result Date: 10/10/2016 CLINICAL DATA:  Code stroke. Slurred speech and left-sided weakness. EXAM: CT HEAD WITHOUT CONTRAST TECHNIQUE: Contiguous axial images were obtained from the base of the skull through the vertex without intravenous contrast. COMPARISON:  07/27/2015 FINDINGS: Brain: No acute infarct is noted. There is asymmetric cortical atrophy and white matter gliosis in the right cerebral hemisphere that has progressed. Remote basal ganglia infarct on the right. Background of generalized atrophy, especially prominent in the posterior fossa. No hemorrhage or hydrocephalus.  Negative for mass. Vascular: Atherosclerotic calcifications. Possible hyperdense right M2 branch, although this could also be atherosclerotic plaque. Skull: No acute finding Sinuses/Orbits: No acute finding Other: Page sent at the time of interpretation on 10/10/2016 at 11:09 am to Dr. Leonel Ramsay. ASPECTS [facility] Stroke Program Early CT Score) Not scored due to degree of chronic change IMPRESSION: 1. No acute hemorrhage or visible acute infarct. 2. Atherosclerotic calcification versus hyperdense right M2 branch. 3. Remote right basal ganglia infarct. Asymmetric atrophy and white  matter gliosis in the right cerebral hemisphere which can indicate chronic ischemia and arterial stenosis. 4. Generalized atrophy, most prominent in the posterior fossa. Electronically Signed   By: Monte Fantasia M.D.   On: 10/10/2016 11:13    EKG  EKG Interpretation  Date/Time:  Monday October 10 2016 11:04:21  EST Ventricular Rate:  76 PR Interval:    QRS Duration: 117 QT Interval:  365 QTC Calculation: 411 R Axis:   -24 Text Interpretation:  Atrial fibrillation Nonspecific intraventricular conduction delay `marked st dep/t inversion v2-v5 Confirmed by Ashok Cordia  MD, Lennette Bihari ([id number]) on 10/10/2016 11:08:05 AM Also confirmed by Ashok Cordia  MD, Lennette Bihari ([id number]), editor [place], [place], [place] [id number])  on 10/10/2016 11:23:44 AM       Radiology Ct Head Code Stroke W/o Cm  Result Date: 10/10/2016 CLINICAL DATA:  Code stroke. Slurred speech and left-sided weakness. EXAM: CT HEAD WITHOUT CONTRAST TECHNIQUE: Contiguous axial images were obtained from the base of the skull through the vertex without intravenous contrast. COMPARISON:  07/27/2015 FINDINGS: Brain: No acute infarct is noted. There is asymmetric cortical atrophy and white matter gliosis in the right cerebral hemisphere that has progressed. Remote basal ganglia infarct on the right. Background of generalized atrophy, especially prominent in the posterior fossa. No hemorrhage or hydrocephalus.  Negative for mass. Vascular: Atherosclerotic calcifications. Possible hyperdense right M2 branch, although this could also be atherosclerotic plaque. Skull: No acute finding Sinuses/Orbits: No acute finding Other: Page sent at the time of interpretation on 10/10/2016 at 11:09 am to Dr. Leonel Ramsay. ASPECTS [facility] Stroke Program Early CT Score) Not scored due to degree of chronic change IMPRESSION: 1. No acute hemorrhage or visible acute infarct. 2. Atherosclerotic calcification versus hyperdense right M2 branch. 3. Remote right basal ganglia infarct. Asymmetric atrophy and white matter gliosis in the right cerebral hemisphere which can indicate chronic ischemia and arterial stenosis. 4. Generalized atrophy, most prominent in the posterior fossa. Electronically Signed   By: Monte Fantasia M.D.   On: 10/10/2016 11:13    Procedures Procedures (including critical care  time)  Medications Ordered in ED Medications - No data to display   Initial Impression / Assessment and Plan / ED Course  I have reviewed the triage vital signs and the nursing notes.  Labs and ct ordered.  Neurology evaluated patient in ED - they have ordered mri.   Pertinent labs & imaging results that were available during my care of the patient were reviewed by me and considered in my medical decision making (see chart for details).  Given recent tx pna, persistent cough, chest congestion on exam, and elevated wbc, will add cxr, cultures, and cover w dose abx in ED.   Iv abx.  Trop is high, and ecg abnormal/changed - patient denies chest pain.  Repeat ecg/repeat trop pending.   Hospitalists consulted for admission.  DNR noted on chart/patients papers.   Discussed with [facility] APP who indicates admit to Dr Dillon Bjork, tele.   Recheck pt, alert, content appearing, no change in neuro exam from prior.      Final Clinical Impressions(s) / ED Diagnoses   Final diagnoses:  CVA (cerebral vascular accident) ([place])  CVA (cerebral vascular accident) ([place])  Stroke (cerebrum) ([place])  Stroke (cerebrum) ([place])  Stroke (cerebrum) ([place])    New Prescriptions New Prescriptions   No medications on file         Lajean Saver, MD 10/10/16 1534

## 2016-10-10 NOTE — Progress Notes (Addendum)
RN paged elevated troponin. 1st trop in ED .46. Now, .51. Per RN, pt has not c/o any chest pain. Pt already on statin and Xeralto. Admitting MD called cardio consult. Will continue to cycle troponins. If next one elevated further or if CP or EKG changes, will call cardio tonight. 12 lead now and at 0500.  KJKG, NP [facility]

## 2016-10-10 NOTE — ED Notes (Signed)
Patient at MRI 

## 2016-10-11 DIAGNOSIS — R2990 Unspecified symptoms and signs involving the nervous system: Secondary | ICD-10-CM

## 2016-10-11 LAB — BASIC METABOLIC PANEL
ANION GAP: 10 (ref 5–15)
Anion gap: 9 (ref 5–15)
BUN: 31 mg/dL — AB (ref 6–20)
BUN: 29 mg/dL — ABNORMAL HIGH (ref 6–20)
CALCIUM: 8.5 mg/dL — AB (ref 8.9–10.3)
CHLORIDE: 111 mmol/L (ref 101–111)
CO2: 27 mmol/L (ref 22–32)
CO2: 25 mmol/L (ref 22–32)
CREATININE: 1.68 mg/dL — AB (ref 0.61–1.24)
CREATININE: 1.54 mg/dL — AB (ref 0.61–1.24)
Calcium: 8.4 mg/dL — ABNORMAL LOW (ref 8.9–10.3)
Chloride: 108 mmol/L (ref 101–111)
GFR calc Af Amer: 41 mL/min — ABNORMAL LOW (ref >60)
GFR calc non Af Amer: 40 mL/min — ABNORMAL LOW (ref >60)
GFR, EST AFRICAN AMERICAN: 46 mL/min — AB (ref >60)
GFR, EST NON AFRICAN AMERICAN: 36 mL/min — AB (ref >60)
GLUCOSE: 221 mg/dL — AB (ref 65–99)
Glucose, Bld: 86 mg/dL (ref 65–99)
POTASSIUM: 3.4 mmol/L — AB (ref 3.5–5.1)
Potassium: 4.2 mmol/L (ref 3.5–5.1)
SODIUM: 144 mmol/L (ref 135–145)
SODIUM: 146 mmol/L — AB (ref 135–145)

## 2016-10-11 LAB — CBC
HCT: 34.1 % — ABNORMAL LOW (ref 39.0–52.0)
Hemoglobin: 11.2 g/dL — ABNORMAL LOW (ref 13.0–17.0)
MCH: 30.2 pg (ref 26.0–34.0)
MCHC: 32.8 g/dL (ref 30.0–36.0)
MCV: 91.9 fL (ref 78.0–100.0)
PLATELETS: 281 10*3/uL (ref 150–400)
RBC: 3.71 MIL/uL — AB (ref 4.22–5.81)
RDW: 14 % (ref 11.5–15.5)
WBC: 14.1 10*3/uL — ABNORMAL HIGH (ref 4.0–10.5)

## 2016-10-11 LAB — GLUCOSE, CAPILLARY
GLUCOSE-CAPILLARY: 79 mg/dL (ref 65–99)
Glucose-Capillary: 197 mg/dL — ABNORMAL HIGH (ref 65–99)
Glucose-Capillary: 184 mg/dL — ABNORMAL HIGH (ref 65–99)
Glucose-Capillary: 125 mg/dL — ABNORMAL HIGH (ref 65–99)

## 2016-10-11 LAB — INFLUENZA PANEL BY PCR (TYPE A & B)
INFLAPCR: NEGATIVE
Influenza B By PCR: NEGATIVE

## 2016-10-11 LAB — LIPID PANEL
Cholesterol: 68 mg/dL (ref 0–200)
HDL: 21 mg/dL — ABNORMAL LOW (ref >40)
LDL CALC: 11 mg/dL (ref 0–99)
Total CHOL/HDL Ratio: 3.2 RATIO
Triglycerides: 178 mg/dL — ABNORMAL HIGH (ref <150)
VLDL: 36 mg/dL (ref 0–40)

## 2016-10-11 LAB — TROPONIN I
Troponin I: 0.46 ng/mL (ref <0.03)
Troponin I: 0.3 ng/mL (ref <0.03)

## 2016-10-11 LAB — MAGNESIUM: Magnesium: 2.5 mg/dL — ABNORMAL HIGH (ref 1.7–2.4)

## 2016-10-11 LAB — MRSA PCR SCREENING: MRSA BY PCR: NEGATIVE

## 2016-10-11 MED ORDER — AZITHROMYCIN 500 MG PO TABS
500.0000 mg | ORAL_TABLET | Freq: Every day | ORAL | Status: DC
  Administered 2016-10-12 – 2016-10-13 (×2): 500 mg via ORAL
  Filled 2016-10-11 (×3): qty 1

## 2016-10-11 MED ORDER — IPRATROPIUM-ALBUTEROL 0.5-2.5 (3) MG/3ML IN SOLN: 3.0000 mL | RESPIRATORY_TRACT | Status: DC | PRN

## 2016-10-11 MED ORDER — SODIUM CHLORIDE 0.9 % IV SOLN
INTRAVENOUS | Status: DC
  Administered 2016-10-11: 22:00:00 via INTRAVENOUS

## 2016-10-11 NOTE — Care Management Note (Signed)
Case Management Note  Patient Details  Name: Ottis Tellman MRN: [id number] Date of Birth: [birth year]  Subjective/Objective:     Patient presented with slurred speech and weakness. Sent via EMS from [facility].  CM will notify CSW and will continue to follow for discharge needs pending PT/OT evals and physician orders.            Action/Plan:   Expected Discharge Date:                  Expected Discharge Plan:     In-House Referral:     Discharge planning Services     Post Acute Care Choice:    Choice offered to:     DME Arranged:    DME Agency:     HH Arranged:    HH Agency:     Status of Service:     If discussed at [employer] of Stay Meetings, dates discussed:    Additional Comments:  Rolm Baptise, RN 10/11/2016, 11:11 AM

## 2016-10-11 NOTE — Evaluation (Signed)
Clinical/Bedside Swallow Evaluation Patient Details  Name: Robert Rojas MRN: [id number] Date of Birth: July 02, 1930  Today's Date: 10/11/2016 Time: SLP Start Time (ACUTE ONLY): 1038 SLP Stop Time (ACUTE ONLY): 1058 SLP Time Calculation (min) (ACUTE ONLY): 20 min  Past Medical History:  Past Medical History:  Diagnosis Date  . Arthralgia    left knee/patella/tibia/fibula  . BCC (basal cell carcinoma)   . Coronary artery disease   . CVA (cerebral infarction)    left sided  . Dysarthria   . Erectile dysfunction   . Essential hypertension   . GERD (gastroesophageal reflux disease)   . Hyperlipemia   . MI (myocardial infarction) 01/2011  . Prostatitis   . Spinal stenosis   . TIA (transient ischemic attack)   . Type 2 diabetes mellitus ([place])    Past Surgical History:  Past Surgical History:  Procedure Laterality Date  . CORONARY ANGIOPLASTY WITH STENT PLACEMENT  2007  . KNEE SURGERY Left   . LUMBAR LAMINECTOMY     HPI:  83 y.o.malewith medical history significant for CAD, CVA, hypertension, hyperlipidemia, MI, diabetes, presents to the emergency department with complaint of slurred speech. Initial evaluation concerning for TIA/stroke and/or possible pneumonia. Pt with recent admission for PNA. His daughter says that he has mild dysphagia from prior CVAs but is on a regular diet and thin liquids with use of aspiration precautions.    Assessment / Plan / Recommendation Clinical Impression  Pt has multiple swallows and strong, explosive coughing after even very small sips of water. He and his daughter report that he consumes thin liquids without difficulty PTA with general aspiration precautions. Given overt difficulty and recent admissin for PNA, recommend for pt to remain NPO except for few ice chips after oral care pending completion of MBS.    Aspiration Risk  Moderate aspiration risk    Diet Recommendation NPO;Ice chips PRN after oral care   Medication Administration: Via  alternative means    Other  Recommendations Oral Care Recommendations: Oral care QID;Oral care prior to ice chip/H20   Follow up Recommendations        Frequency and Duration            Prognosis Prognosis for Safe Diet Advancement: Good      Swallow Study   General HPI: 83 y.o.malewith medical history significant for CAD, CVA, hypertension, hyperlipidemia, MI, diabetes, presents to the emergency department with complaint of slurred speech. Initial evaluation concerning for TIA/stroke and/or possible pneumonia. Pt with recent admission for PNA. His daughter says that he has mild dysphagia from prior CVAs but is on a regular diet and thin liquids with use of aspiration precautions.  Type of Study: Bedside Swallow Evaluation Previous Swallow Assessment: multiple swallow assessments per daughter but none in chart - see HPI Diet Prior to this Study: NPO Temperature Spikes Noted: No Respiratory Status: Nasal cannula History of Recent Intubation: No Behavior/Cognition: Alert;Cooperative;Pleasant mood Oral Cavity Assessment: Dry;Dried secretions Oral Cavity - Dentition: Dentures, not available Self-Feeding Abilities: Able to feed self Patient Positioning: Upright in bed Baseline Vocal Quality: Normal Volitional Cough: Weak    Oral/Motor/Sensory Function Overall Oral Motor/Sensory Function: Other (comment) (baseline dysarthria, daughter says it is not quite baseline)   Ice Chips Ice chips: Within functional limits Presentation: Spoon   Thin Liquid Thin Liquid: Impaired Presentation: Cup;Spoon;Self Fed Pharyngeal  Phase Impairments: Cough - Immediate;Multiple swallows;Decreased hyoid-laryngeal movement    Nectar Thick Nectar Thick Liquid: Not tested   Honey Thick Honey Thick Liquid: Not tested  Puree Puree: Impaired Presentation: Self Fed;Spoon Pharyngeal Phase Impairments: Decreased hyoid-laryngeal movement   Solid   GO   Solid: Not tested        Germain Osgood 10/11/2016,1:37 PM  Germain Osgood, M.A. CCC-SLP [phone]

## 2016-10-11 NOTE — Progress Notes (Signed)
Subjective: Patient states that his left-sided numbness has improved and also makes note that this is his baseline.  Exam: Vitals:   10/11/16 0622 10/11/16 1014  BP: 140/62 127/76  Pulse: 64 (!) 102  Resp: 18 18  Temp: 98.4 F (36.9 C) 98 F (36.7 C)    HEENT-  Normocephalic, no lesions, without obvious abnormality.  Normal external eye and conjunctiva.  Normal TM's bilaterally.  Normal auditory canals and external ears. Normal external nose, mucus membranes and septum.  Normal pharynx. Cardiovascular- S1, S2 normal, pulses palpable throughout   Lungs- no chest wall deformities or tenderness Abdomen- soft, non-tender; bowel sounds normal; no masses,  no organomegaly Extremities- no edema     Gen: In bed, NAD MS: Patient is alert, he knows he is at the hospital but does not know the year or what the name of the hospital is. He is able to follow commands without difficulty. Voice is dysarthric.  Cranial Nerves: II:  Visual fields grossly normal,  III,IV, VI: ptosis not present, extra-ocular motions intact bilaterally, pupils equal, round, reactive to light and accommodation V,VII: smile symmetric, facial light touch sensation normal bilaterally VIII: hearing normal bilaterally IX,X: uvula rises symmetrically XI: bilateral shoulder shrug XII: midline tongue extension   Motor: 5/5 throughout with the exception of his left upper extremity which was 4/5. Sensory: Today he states that his sensation is equal bilaterally DTR: 1+ and symmetric throughout upper extremities with no knee jerk or ankle   Pertinent Labs/Diagnostics: UA with a rare bacteria and moderate leukocytes Gram stain, sputum culture, strep pneumonia or Rosanne Gutting antigen all pending Blood cultures pending   Etta Quill PA-C [facility] [phone]  Impression: 83 y.o. male presenting to [facility] ED as a code stroke secondary to increased slurred speech and left-sided weakness. Patient tells me that  he feels improved today and he feels that he is back to his baseline. His baseline is left-sided weakness and decreased sensation. He has no complaints. MRI of brain showed no acute intracranial infarct or bleed. She does have an elevated white blood cell count. UA does show bacteria with moderate leukocytes and cloudy appearance. Most likely cause of his symptoms is direct recrudescence of previous stroke symptoms  Though TIAs possible, is oriented and anticoagulation and his LDL is actually quite low at 11, and I don't feel any further workup is likely to be beneficial.  This time I would continue treating her medically, no further recommendations from neurology and we will sign off.  Roland Rack, MD [facility] [phone]  If 7pm- 7am, please page neurology on call as listed in [place].  10/11/2016, 1:33 PM

## 2016-10-11 NOTE — Evaluation (Signed)
Occupational Therapy Evaluation Patient Details Name: Robert Rojas MRN: [id number] DOB: December 11, 1929 Today's Date: 10/11/2016    History of Present Illness Pt is an 83 y.o. male who presented to the ED with concerns of slurred speech. Pt has a PMH significant for CAD, CVA, hypertension, hyperlipidemia, MI diabetes, and mild dementia. He has residual L hemiparesis from a previous stroke. Imaging negative for acute abnormalities but reveal serial stenoses in MCA territories.   Clinical Impression   Pt reports that he was independent with Rollator for ADL and functional mobility prior to admission. Unsure of reliability of pt report due to history of cognitive impairments. Pt demonstrates decreased strength in B UE with L greater than R. He does report residual weakness with L UE from previous stroke. He additionally presents with poor problem solving abilities and is disoriented to time and situation. Pt requires mod assist for toilet transfers and max assist for LB ADL. He would benefit from continued OT services while admitted to improve independence with ADL and functional mobility. Recommend SNF placement post-acute D/C for continued rehabilitation in order to maximize return to PLOF. Will continue to follow acutely.   Follow Up Recommendations  SNF;Supervision/Assistance - 24 hour    Equipment Recommendations  Other (comment) (TBD at next venue of care)    Recommendations for Other Services       Precautions / Restrictions Precautions Precautions: Fall Restrictions Weight Bearing Restrictions: No      Mobility Bed Mobility Overal bed mobility: Needs Assistance Bed Mobility: Supine to Sit     Supine to sit: Mod assist Sit to supine: Mod assist   General bed mobility comments: assist for lifting trunk, moves legs off bed unaided, assist to supine legs into bed  Transfers Overall transfer level: Needs assistance Equipment used: Rolling walker (2 wheeled) Transfers: Sit to/from  Stand Sit to Stand: Mod assist         General transfer comment: increased time, cues for hand placement, lifting assist    Balance Overall balance assessment: Needs assistance Sitting-balance support: Feet supported Sitting balance-Leahy Scale: Fair Sitting balance - Comments: sits with poor posture, but unaided   Standing balance support: Bilateral upper extremity supported Standing balance-Leahy Scale: Poor Standing balance comment: UE support for balance and assist                            ADL Overall ADL's : Needs assistance/impaired     Grooming: Minimal assistance;Sitting   Upper Body Bathing: Minimal assistance;Sitting   Lower Body Bathing: Maximal assistance;Sit to/from stand   Upper Body Dressing : Minimal assistance;Sitting   Lower Body Dressing: Maximal assistance;Sit to/from stand   Toilet Transfer: Moderate assistance;Stand-pivot;BSC;RW   Toileting- Clothing Manipulation and Hygiene: Maximal assistance;Sit to/from stand         General ADL Comments: Upon standing for simulated toilet transfer, pt's condom catheter slipped off and pt assisted back to sitting to clean up.      Vision Vision Assessment?: Vision impaired- to be further tested in functional context Additional Comments: Difficulty tracking and following commands for field testing.   Perception     Praxis      Pertinent Vitals/Pain Pain Assessment: No/denies pain     Hand Dominance Right   Extremity/Trunk Assessment Upper Extremity Assessment Upper Extremity Assessment: LUE deficits/detail;RUE deficits/detail RUE Deficits / Details: reports history of R shoulder surgery and continued weakness LUE Deficits / Details: weaker compared to R reports h/o stroke LUE  Coordination: decreased fine motor   Lower Extremity Assessment Lower Extremity Assessment: RLE deficits/detail;LLE deficits/detail RLE Deficits / Details: AAROM WFL, strength hip flexion 3-/5, knee extension  4+/5, ankle DF 4+/5 LLE Deficits / Details: AAROM WFL, strength hip flexion 2+/5, knee extension 4/5, ankle DF 4+/5       Communication Communication Communication: Expressive difficulties (significant dysarthria)   Cognition Arousal/Alertness: Awake/alert Behavior During Therapy: WFL for tasks assessed/performed Overall Cognitive Status: History of cognitive impairments - at baseline                 General Comments: Pt with poor safety awareness, poor short-term memory, and poor problem solving skills. No family present for evaluation, but pt has history of cognitive impairements.   General Comments       Exercises       Shoulder Instructions      Home Living Family/patient expects to be discharged to:: Skilled nursing facility Living Arrangements: Spouse/significant other (was living in SNF)                           Home Equipment: Walker - 4 wheels          Prior Functioning/Environment Level of Independence: Independent with assistive device(s)        Comments: Pt reports independence with rollator PTA.        OT Problem List: Decreased strength;Decreased range of motion;Decreased activity tolerance;Impaired balance (sitting and/or standing);Decreased safety awareness;Decreased cognition;Decreased knowledge of use of DME or AE;Decreased knowledge of precautions;Impaired UE functional use;Impaired vision/perception   OT Treatment/Interventions: Self-care/ADL training;Therapeutic exercise;Energy conservation;DME and/or AE instruction;Therapeutic activities;Cognitive remediation/compensation;Patient/family education;Balance training;Visual/perceptual remediation/compensation    OT Goals(Current goals can be found in the care plan section) Acute Rehab OT Goals Patient Stated Goal: To get stronger OT Goal Formulation: With patient Time For Goal Achievement: 10/18/16 Potential to Achieve Goals: Good ADL Goals Pt Will Perform Grooming:  standing;with min guard assist Pt Will Perform Upper Body Dressing: with modified independence;sitting Pt Will Perform Lower Body Dressing: with min assist;sit to/from stand Pt Will Transfer to Toilet: with min guard assist;ambulating;bedside commode (BSC over toilet) Additional ADL Goal #1: Pt will complete functional vision assessment with no more than 2 errors. Additional ADL Goal #2: Pt will complete bed mobility at modified independent level in preparation for ADL tasks.  OT Frequency: Min 2X/week   Barriers to D/C:            Co-evaluation              End of Session Equipment Utilized During Treatment: Gait belt;Rolling walker Nurse Communication: Other (comment) (Condom catheter came off)  Activity Tolerance: Patient tolerated treatment well Patient left: in bed;with call bell/phone within reach;with bed alarm set;with nursing/sitter in room   Time: [id number] OT Time Calculation (min): 31 min Charges:  OT General Charges $OT Visit: 1 Procedure OT Evaluation $OT Eval Moderate Complexity: 1 Procedure OT Treatments $Self Care/Home Management : 8-22 mins G-Codes:    Norman Herrlich, OTR/L [phone] 10/11/2016, 5:39 PM

## 2016-10-11 NOTE — Progress Notes (Addendum)
Inpatient Diabetes Program Recommendations  AACE/ADA: New Consensus Statement on Inpatient Glycemic Control (2015)  Target Ranges:  Prepandial:   less than 140 mg/dL      Peak postprandial:   less than 180 mg/dL (1-2 hours)      Critically ill patients:  140 - 180 mg/dL   Lab Results  Component Value Date   GLUCAP 197 (H) 10/11/2016   HGBA1C 7.0 (H) 07/28/2015   Results for PRESS, GALESKI (MRN [id number]) as of 10/11/2016 09:06  Ref. Range 10/10/2016 11:10 10/10/2016 17:44 10/10/2016 22:25 10/11/2016 06:00  Glucose-Capillary Latest Ref Range: 65 - 99 mg/dL 242 (H) 226 (H) 195 (H) 197 (H)   Review of Glycemic Control  Diabetes history: DM2 Outpatient Diabetes medications: Humalog SSI QID (Max 48 units daily), Januvia 100 mg daily, Lantus 40 units daily Current orders for Inpatient glycemic control: Novolog 0-15 units TIDAC and 0-5 units QHS, Lantus 40 units daily  Inpatient Diabetes Program Recommendations:     While NPO, MD please consider changing to Moderate correction scale to Novolog 0-15 units Q4H.     MD, also please consider decreasing Lantus to 30 units daily.     Noted that no insulin has been given to cover high CBG's.     NURSING: please give correction SSI and basal insulin per orders even while patient is NPO.     Per ADA recommendations "consider performing an A1C on all patients with diabetes or hyperglycemia admitted to the hospital if not performed in the prior 3 months".  Thank you,  Windy Carina, RN, MSN Diabetes Coordinator Inpatient Diabetes Program [phone] (Team Pager)

## 2016-10-11 NOTE — Evaluation (Signed)
Physical Therapy Evaluation Patient Details Name: Robert Rojas MRN: [id number] DOB: [birth year] Today's Date: 10/11/2016   History of Present Illness  Pt is an 83 y.o. male who presented to the ED with concerns of slurred speech. Pt has a PMH significant for CAD, CVA, hypertension, hyperlipidemia, MI diabetes, and mild dementia. He has residual L hemiparesis from a previous stroke. Imaging negative for acute abnormalities but reveal serial stenoses in MCA territories.  Clinical Impression  Patient presents with decreased mobility due to deficits listed in PT problem list.  Presents at mod A level for mobility and, though unreliable states was indep with RW previously.  Feel he can benefit from continued skilled PT in the acute setting followed by SNF rehab at d/c.    Follow Up Recommendations SNF    Equipment Recommendations  None recommended by PT    Recommendations for Other Services       Precautions / Restrictions Precautions Precautions: Fall Restrictions Weight Bearing Restrictions: No      Mobility  Bed Mobility Overal bed mobility: Needs Assistance Bed Mobility: Supine to Sit     Supine to sit: Mod assist Sit to supine: Mod assist   General bed mobility comments: assist for lifting trunk, moves legs off bed unaided, assist to supine legs into bed  Transfers Overall transfer level: Needs assistance Equipment used: Rolling walker (2 wheeled) Transfers: Sit to/from Stand Sit to Stand: Mod assist         General transfer comment: increased time, cues for hand placement, lifting assist  Ambulation/Gait Ambulation/Gait assistance: Mod assist Ambulation Distance (Feet): 15 Feet (x 2) Assistive device: Rolling walker (2 wheeled) Gait Pattern/deviations: Shuffle;Decreased dorsiflexion - left;Decreased step length - left;Trunk flexed;Wide base of support;Leaning posteriorly Gait velocity: slow   General Gait Details: to and from bathroom with fecal incontinence  along the way; with fatigue more difficulty progressing L LE, cues and assist for balance to back up to toilet and to bed  Stairs            Wheelchair Mobility    Modified Rankin (Stroke Patients Only)       Balance Overall balance assessment: Needs assistance Sitting-balance support: Feet supported Sitting balance-Leahy Scale: Fair Sitting balance - Comments: sits with poor posture, but unaided   Standing balance support: Bilateral upper extremity supported Standing balance-Leahy Scale: Poor Standing balance comment: UE support for balance and assist                             Pertinent Vitals/Pain Pain Assessment: No/denies pain    Home Living Family/patient expects to be discharged to:: Skilled nursing facility Living Arrangements: Spouse/significant other (was living in SNF)             Home Equipment: Walker - 4 wheels      Prior Function Level of Independence: Independent with assistive device(s)         Comments: Pt reports independence with rollator PTA.     Hand Dominance        Extremity/Trunk Assessment   Upper Extremity Assessment Upper Extremity Assessment: LUE deficits/detail;RUE deficits/detail RUE Deficits / Details: reports history of R shoulder surgery and continued weakness LUE Deficits / Details: weaker compared to R reports h/o stroke    Lower Extremity Assessment Lower Extremity Assessment: RLE deficits/detail;LLE deficits/detail RLE Deficits / Details: AAROM WFL, strength hip flexion 3-/5, knee extension 4+/5, ankle DF 4+/5 LLE Deficits / Details: AAROM WFL, strength hip  flexion 2+/5, knee extension 4/5, ankle DF 4+/5       Communication   Communication: Expressive difficulties (significant dysarthria)  Cognition Arousal/Alertness: Awake/alert Behavior During Therapy: WFL for tasks assessed/performed Overall Cognitive Status: History of cognitive impairments - at baseline                       General Comments General comments (skin integrity, edema, etc.): noted needed to have bowel movement upon standing, assisted to bathroom, but already incontinent along the way    Exercises     Assessment/Plan    PT Assessment Patient needs continued PT services  PT Problem List Decreased strength;Decreased balance;Decreased knowledge of use of DME;Decreased activity tolerance;Decreased mobility;Decreased knowledge of precautions          PT Treatment Interventions DME instruction;Gait training;Therapeutic exercise;Patient/family education;Functional mobility training;Balance training;Therapeutic activities    PT Goals (Current goals can be found in the Care Plan section)  Acute Rehab PT Goals Patient Stated Goal: To get stronger PT Goal Formulation: With patient Time For Goal Achievement: 10/25/16 Potential to Achieve Goals: Fair    Frequency Min 2X/week   Barriers to discharge        Co-evaluation               End of Session Equipment Utilized During Treatment: Gait belt Activity Tolerance: Patient limited by fatigue Patient left: in bed;with call bell/phone within reach;with bed alarm set           Time: [id number] PT Time Calculation (min) (ACUTE ONLY): 38 min   Charges:   PT Evaluation $PT Eval Moderate Complexity: 1 Procedure PT Treatments $Gait Training: 8-22 mins $Therapeutic Activity: 8-22 mins   PT G Codes:        Reginia Naas October 20, 2016, 5:14 PM Magda Kiel,  10-20-16

## 2016-10-11 NOTE — Progress Notes (Signed)
[facility] Progress Note  Patient: Robert Rojas [id number]   PCP: Perrin Maltese, MD DOB: 03-07-1930   DOA: 10/10/2016   DOS: 10/11/2016   Date of Service: the patient was seen and examined on 10/11/2016   Subjective: Denies any acute complaint, mentions that he is tired of everything and wants to talk to his wife.  Brief hospital course: Pt. with PMH of CVA, HTN, CAD, AKI, possible dementia; admitted on 10/10/2016, with complaint of shortness of breath and cough as well as left-sided weakness, was found to have recurrences after his stroke, healthcare associated pneumonia. Currently further plan is continue pneumatics.  Assessment and Plan: 1. Strokelike symptoms.  Facility reported worsening slurred speech with generalized weakness. History of CVA residual left-sided weakness. Risk factors include diabetes, CAD, hypertension afib. CT of the head no acute bleed or stroke. MRI of the brain MRA of the head and neck acute hemorrhage or visible infarct. Does reveal MCA stenosis. Evaluated by neurology who opined likely related to infectious process. Recommended if MRI and MRA negative further evaluation for underlying infection and/or other physiological stressor versus complete stroke workup -Speech therapy patient with a history of dysarthria failed bedside swallow eval -Lipid panel -Continue aspirin -physical therapy and occupational therapy - Neurology considers this to be a recrudescence of his chronic symptoms.  2. Pneumonia. Patient with history of same.chest xray with bronchitis, leukocytosis, exam with congestion. Hemodynamically stable lactic acid within the limits of normal. Oxygen saturation level greater than 90 on 2 L. Of note leukocytosis 19.4 which is down from last week but trending up from 3 days ago. He is nontoxic appearing -Follow blood cultures -antibiotics per protocol -Sputum culture -strep pneumo urine antigen - negative influenza panel -IV fluids -Blood  pressure is a little  on the soft side of normal. Will hold coreg, cardizem, lasix  3. Elevated troponin/abnormal EKG.  Likely demand ischemia in the setting of pneumonia. Hx cad. Home meds include aspirin, Coreg, Lanoxin, Cardizem, Crestor Patient denies any chest pain. Initial troponin 0.46. -Monitor on telemetry -cycle troponin -Continue statin -cardiology consult request by admitting provider  4. A. Fib.Mali score 4.  On xarelto. ekg as noted above. Home meds include coreg, dig, diltiazem. HR 70's on admission -resume home meds when indicated  5. Diabetes. Serum glucose 150 on admission. Home medications include Lantus 40 units as well as Januvia and sliding scale. - Will hold oral agents for now - will continue Lantus -Obtain a hemoglobin A1c -sliding scale insulin for optimal control  6. Acute kidney injury. Likely related to decreased oral intake in the setting of above. Creatinine 1.7 on admission chart review indicates baseline closer to 1.2 -Hold nephrotoxins -gentle IV fluids -Monitor urine output -Recheck in the morning  Bowel regimen: last BM 10/10/2016 Diet: npo DVT Prophylaxis: subcutaneous Heparin  Advance goals of care discussion: DNR DNI  Family Communication: no family was present at bedside, at the time of interview.  Disposition:  Discharge to SNF. Expected discharge date: 10/12/2016, stabilization of respiratory symptoms.  Consultants: Neurology, cardiology Procedures: Echocardiogram, carotid Doppler  Antibiotics: Anti-infectives    Start     Dose/Rate Route Frequency Ordered Stop   10/11/16 1000  azithromycin (ZITHROMAX) tablet 500 mg     500 mg Oral Daily 10/11/16 0753     10/10/16 1330  vancomycin (VANCOCIN) 1,250 mg in sodium chloride 0.9 % 250 mL IVPB  Status:  Discontinued     1,250 mg 166.7 mL/hr over 90 Minutes Intravenous Every 24 hours 10/10/16  1308 10/11/16 0753   10/10/16 1315  ceFEPIme (MAXIPIME) 2 g in dextrose 5 % 50 mL IVPB     Comments:  Start after cultures drawn   2 g 100 mL/hr over 30 Minutes Intravenous Every 24 hours 10/10/16 1307     10/10/16 1200  ceFEPIme (MAXIPIME) 2 g in dextrose 5 % 50 mL IVPB  Status:  Discontinued    Comments:  Start after cultures drawn   2 g 100 mL/hr over 30 Minutes Intravenous  Once 10/10/16 1151 10/10/16 1307   10/10/16 1200  vancomycin (VANCOCIN) IVPB 1000 mg/200 mL premix  Status:  Discontinued    Comments:  Start after cultures drawn   1,000 mg 200 mL/hr over 60 Minutes Intravenous  Once 10/10/16 1151 10/10/16 1308      Objective: Physical Exam: Vitals:   10/11/16 0430 10/11/16 0622 10/11/16 1014 10/11/16 1348  BP: (!) 123/48 140/62 127/76 (!) 140/55  Pulse: 62 64 (!) 102 67  Resp: 18 18 18 18   Temp: 98.4 F (36.9 C) 98.4 F (36.9 C) 98 F (36.7 C) 97.8 F (36.6 C)  TempSrc: Axillary Axillary Oral Oral  SpO2: 99% 96% 99% 100%  Weight:      Height:        Intake/Output Summary (Last 24 hours) at 10/11/16 1631 Last data filed at 10/11/16 0400  Gross per 24 hour  Intake                0 ml  Output                0 ml  Net                0 ml   Filed Weights   10/10/16 1126  Weight: 81.3 kg (179 lb 3.7 oz)   General: Alert, Awake and Oriented to Person. Appear in mild distress, affect appropriate Eyes: PERRL, Conjunctiva normal ENT: Oral Mucosa clear moist. Neck: no JVD, no Abnormal Mass Or lumps Cardiovascular: S1 and S2 Present, aortic systolic Murmur, Respiratory: Bilateral Air entry equal and Decreased, no use of accessory muscle, bilateral Crackles, Occasional wheezes Abdomen: Bowel Sound present, Soft and no tenderness Skin: no redness, no Rash, no induration Extremities: no Pedal edema, no calf tenderness Neurologic: Dysarthria, confusion and Left-sided weakness  Data Reviewed: CBC:  Recent Labs Lab 10/05/16 0401 10/07/16 0451 10/10/16 1040 10/10/16 1050 10/11/16 0257  WBC 15.4* 11.5* 19.4*  --  14.1*  NEUTROABS  --   --  15.7*  --    --   HGB 12.5* 12.4* 12.5* 12.2* 11.2*  HCT 36.0* 35.2* 37.0* 36.0* 34.1*  MCV 88.0 88.9 90.9  --  91.9  PLT 206 216 283  --  [id number]   Basic Metabolic Panel:  Recent Labs Lab 10/05/16 0401 10/07/16 0451 10/10/16 1040 10/10/16 1050 10/11/16 0257  NA 137 139 142 142 144  K 3.4* 3.6 3.9 3.8 4.2  CL 105 106 106 103 108  CO2 26 26 27   --  27  GLUCOSE 77 106* 250* 247* 221*  BUN 16 23* 32* 32* 31*  CREATININE 1.02 1.31* 1.77* 1.70* 1.68*  CALCIUM 8.1* 8.4* 8.7*  --  8.5*   Liver Function Tests:  Recent Labs Lab 10/10/16 1040  AST 47*  ALT 45  ALKPHOS 67  BILITOT 1.1  PROT 6.8  ALBUMIN 2.7*   No results for input(s): LIPASE, AMYLASE in the last 168 hours. No results for input(s): AMMONIA in the last 168 hours. Coagulation  Profile:  Recent Labs Lab 10/10/16 1040  INR 1.70   Cardiac Enzymes:  Recent Labs Lab 10/10/16 1945 10/11/16 0257  TROPONINI 0.51* 0.46*   BNP (last 3 results) No results for input(s): PROBNP in the last 8760 hours.  CBG:  Recent Labs Lab 10/10/16 1110 10/10/16 1744 10/10/16 2225 10/11/16 0600 10/11/16 1122  GLUCAP 242* 226* 195* 197* 184*   Studies: No results found.   Scheduled Meds: . azithromycin  500 mg Oral Daily  . ceFEPime (MAXIPIME) IV  2 g Intravenous Q24H  . digoxin  0.25 mg Oral Daily  . insulin aspart  0-15 Units Subcutaneous TID WC  . insulin aspart  0-5 Units Subcutaneous QHS  . insulin glargine  40 Units Subcutaneous Daily  . Rivaroxaban  15 mg Oral Q supper  . rosuvastatin  40 mg Oral QHS   Continuous Infusions: PRN Meds: acetaminophen **OR** acetaminophen (TYLENOL) oral liquid 160 mg/5 mL **OR** acetaminophen, benzonatate, ipratropium-albuterol  Time spent: 30 minutes  Author: Berle Mull, MD [facility] Pager: [phone] 10/11/2016 4:31 PM  If 7PM-7AM, please contact night-coverage at [email or web address], password [facility]

## 2016-10-11 NOTE — Progress Notes (Addendum)
RN paged because EKG said "STEMI". NP reviewed chart and could not visibly review EKG in chart. Pt chest pain free, no n/v, SOB,  or diaphoresis. NP actually wrote a note last shift because troponins were slightly bumped without noted symptoms. Attending wrote note today stating elevation of troponins was likely demand ischemia in face of acute illness. EKG was redone and NP reviewed. No STEMI noted on EKG. Will r/p troponin at this time just to f/up.  KJKG, NP [facility] Troponin back at .30 which is lower from previous ones.  KJKG, NP [facility]

## 2016-10-12 ENCOUNTER — Inpatient Hospital Stay (HOSPITAL_COMMUNITY): Payer: Medicare Other

## 2016-10-12 DIAGNOSIS — I259 Chronic ischemic heart disease, unspecified: Secondary | ICD-10-CM

## 2016-10-12 LAB — CBC WITH DIFFERENTIAL/PLATELET
BASOS ABS: 0 10*3/uL (ref 0.0–0.1)
BASOS PCT: 0 %
Eosinophils Absolute: 0.2 10*3/uL (ref 0.0–0.7)
Eosinophils Relative: 1 %
HEMATOCRIT: 34 % — AB (ref 39.0–52.0)
Hemoglobin: 11.4 g/dL — ABNORMAL LOW (ref 13.0–17.0)
LYMPHS PCT: 8 %
Lymphs Abs: 1.3 10*3/uL (ref 0.7–4.0)
MCH: 30.5 pg (ref 26.0–34.0)
MCHC: 33.5 g/dL (ref 30.0–36.0)
MCV: 90.9 fL (ref 78.0–100.0)
MONO ABS: 1.4 10*3/uL — AB (ref 0.1–1.0)
Monocytes Relative: 8 %
NEUTROS ABS: 13.2 10*3/uL — AB (ref 1.7–7.7)
Neutrophils Relative %: 83 %
PLATELETS: 294 10*3/uL (ref 150–400)
RBC: 3.74 MIL/uL — AB (ref 4.22–5.81)
RDW: 14.1 % (ref 11.5–15.5)
WBC: 16.1 10*3/uL — AB (ref 4.0–10.5)

## 2016-10-12 LAB — STREP PNEUMONIAE URINARY ANTIGEN: STREP PNEUMO URINARY ANTIGEN: NEGATIVE

## 2016-10-12 LAB — GLUCOSE, CAPILLARY
GLUCOSE-CAPILLARY: 117 mg/dL — AB (ref 65–99)
Glucose-Capillary: 76 mg/dL (ref 65–99)
Glucose-Capillary: 147 mg/dL — ABNORMAL HIGH (ref 65–99)
Glucose-Capillary: 193 mg/dL — ABNORMAL HIGH (ref 65–99)

## 2016-10-12 LAB — COMPREHENSIVE METABOLIC PANEL
ALBUMIN: 2.3 g/dL — AB (ref 3.5–5.0)
ALT: 38 U/L (ref 17–63)
AST: 39 U/L (ref 15–41)
Alkaline Phosphatase: 80 U/L (ref 38–126)
Anion gap: 7 (ref 5–15)
BILIRUBIN TOTAL: 0.9 mg/dL (ref 0.3–1.2)
BUN: 30 mg/dL — AB (ref 6–20)
CHLORIDE: 115 mmol/L — AB (ref 101–111)
CO2: 24 mmol/L (ref 22–32)
CREATININE: 1.48 mg/dL — AB (ref 0.61–1.24)
Calcium: 8.3 mg/dL — ABNORMAL LOW (ref 8.9–10.3)
GFR calc Af Amer: 48 mL/min — ABNORMAL LOW (ref >60)
GFR, EST NON AFRICAN AMERICAN: 42 mL/min — AB (ref >60)
GLUCOSE: 77 mg/dL (ref 65–99)
POTASSIUM: 3.5 mmol/L (ref 3.5–5.1)
Sodium: 146 mmol/L — ABNORMAL HIGH (ref 135–145)
Total Protein: 6.4 g/dL — ABNORMAL LOW (ref 6.5–8.1)

## 2016-10-12 LAB — MAGNESIUM: Magnesium: 2.8 mg/dL — ABNORMAL HIGH (ref 1.7–2.4)

## 2016-10-12 LAB — ECHOCARDIOGRAM COMPLETE
HEIGHTINCHES: 70 in
Weight: 2867.74 oz

## 2016-10-12 LAB — HEMOGLOBIN A1C
Hgb A1c MFr Bld: 7.2 % — ABNORMAL HIGH (ref 4.8–5.6)
MEAN PLASMA GLUCOSE: 160 mg/dL

## 2016-10-12 MED ORDER — RESOURCE THICKENUP CLEAR PO POWD
ORAL | Status: DC | PRN
  Filled 2016-10-12 (×2): qty 125

## 2016-10-12 MED ORDER — ASPIRIN EC 81 MG PO TBEC
81.0000 mg | DELAYED_RELEASE_TABLET | Freq: Every day | ORAL | Status: DC
  Administered 2016-10-12 – 2016-10-13 (×2): 81 mg via ORAL
  Filled 2016-10-12 (×2): qty 1

## 2016-10-12 MED ORDER — CARVEDILOL 3.125 MG PO TABS
3.1250 mg | ORAL_TABLET | Freq: Two times a day (BID) | ORAL | Status: DC
  Administered 2016-10-12 – 2016-10-13 (×2): 3.125 mg via ORAL
  Filled 2016-10-12 (×2): qty 1

## 2016-10-12 MED ORDER — AMOXICILLIN-POT CLAVULANATE 500-125 MG PO TABS
1.0000 | ORAL_TABLET | Freq: Three times a day (TID) | ORAL | Status: DC
  Administered 2016-10-12 – 2016-10-13 (×2): 500 mg via ORAL
  Filled 2016-10-12 (×5): qty 1

## 2016-10-12 NOTE — Clinical Social Work Placement (Signed)
   CLINICAL SOCIAL WORK PLACEMENT  NOTE  Date:  10/12/2016  Patient Details  Name: Robert Rojas MRN: [id number] Date of Birth: 17-May-1930  Clinical Social Work is seeking post-discharge placement for this patient at the [place] level of care (*CSW will initial, date and re-position this form in  chart as items are completed):      Patient/family provided with [facility] [facility] Work Department's list of facilities offering this level of care within the geographic area requested by the patient (or if unable, by the patient's family).  Yes   Patient/family informed of their freedom to choose among providers that offer the needed level of care, that participate in Medicare, Medicaid or managed care program needed by the patient, have an available bed and are willing to accept the patient.      Patient/family informed of [facility]'s ownership interest in [facility], [place] and [facility], as well as of the fact that they are under no obligation to receive care at these facilities.  PASRR submitted to EDS on       PASRR number received on 10/12/16     Existing PASRR number confirmed on       FL2 transmitted to all facilities in geographic area requested by pt/family on 10/12/16     FL2 transmitted to all facilities within larger geographic area on       Patient informed that his/her managed care company has contracts with or will negotiate with certain facilities, including the following:        Yes   Patient/family informed of bed offers received.  Patient chooses bed at [facility]     Physician recommends and patient chooses bed at      Patient to be transferred to [facility] on  .  Patient to be transferred to facility by [facility]     Patient family notified on   of transfer.  Name of family member notified:        PHYSICIAN Please prepare priority discharge summary, including medications, Please prepare  prescriptions, Please sign DNR, Please sign FL2     Additional Comment:    _______________________________________________ Alla German, LCSW 10/12/2016, 2:07 PM

## 2016-10-12 NOTE — Progress Notes (Addendum)
[facility] Progress Note  Patient: Robert Rojas [id number]   PCP: Perrin Maltese, MD DOB: 11-16-1929   DOA: 10/10/2016   DOS: 10/12/2016   Date of Service: the patient was seen and examined on 10/12/2016   Subjective: Feeling better, continues to deny any chest pain continues to deny any shortness of breath.  Brief hospital course: Pt. with PMH of CVA, HTN, CAD, AKI, possible dementia; admitted on 10/10/2016, with complaint of shortness of breath and cough as well as left-sided weakness, was found to have recurrences after his stroke, healthcare associated pneumonia. Currently further plan is continue Antibiotics.  Assessment and Plan: 1. Stroke like symptoms.  Facility reported worsening slurred speech with generalized weakness. History of CVA residual left-sided weakness.  CT of the head no acute bleed or stroke.  MRI of the brain MRA no acute hemorrhage or visible infarct. Does reveal MCA stenosis.  Neurology considers this to be a recrudescence of his chronic symptoms. Speech therapy recommends dysphagia 1 diet LDL 11. Continue aspirin PTOT Recommends SNF, social worker working on case.    2. HCAP. chest xray with bronchitis, leukocytosis, exam with congestion.  No growth till date blood cultures Negative strep pneumo urine antigen negative influenza panel Change cefepime to Oral Augmentin to cover for aspiration continue azithromycin for atypical coverage. Finish a total of 7 day treatment course from improvement in WBC.  3. Elevated troponin/abnormal EKG.  demand ischemia in the setting of pneumonia. Trend not supporting ACS diagnosis, echocardiogram pending. Home meds include aspirin, Coreg, Lanoxin, Cardizem, Crestor  Patient denies any chest pain.  Troponin trending down, Continue statin, aspirin.  4. A. Fib.Mali score 4.  On xarelto.  Home meds include coreg, dig, diltiazem.  Rate controlled  5. Diabetes. Serum glucose 150 on admission. Home medications  include Lantus 40 units as well as Januvia and sliding scale. - Will hold oral agents for now - will continue Lantus - hemoglobin A1c 7.2 -sliding scale insulin for optimal control  6. Acute kidney injury. Likely related to decreased oral intake in the setting of above. Creatinine 1.7 on admission chart review indicates baseline closer to 1.2 Hold nephrotoxins  7. HTN Frequent PVCs, suspected chronic systolic CHF. Resume coreg,  Stop cardizem,  Change lasix to PRN  Bowel regimen: last BM 10/10/2016 Diet: npo DVT Prophylaxis: subcutaneous Heparin  Advance goals of care discussion: DNR DNI  Family Communication: no family was present at bedside, at the time of interview.  Disposition:  Discharge to SNF. Expected discharge date: 10/13/2016, stabilization of frequent PVC symptoms.  Consultants: Neurology, cardiology Procedures: Echocardiogram, carotid Doppler  Antibiotics: Anti-infectives    Start     Dose/Rate Route Frequency Ordered Stop   10/11/16 1000  azithromycin (ZITHROMAX) tablet 500 mg     500 mg Oral Daily 10/11/16 0753     10/10/16 1330  vancomycin (VANCOCIN) 1,250 mg in sodium chloride 0.9 % 250 mL IVPB  Status:  Discontinued     1,250 mg 166.7 mL/hr over 90 Minutes Intravenous Every 24 hours 10/10/16 1308 10/11/16 0753   10/10/16 1315  ceFEPIme (MAXIPIME) 2 g in dextrose 5 % 50 mL IVPB    Comments:  Start after cultures drawn   2 g 100 mL/hr over 30 Minutes Intravenous Every 24 hours 10/10/16 1307     10/10/16 1200  ceFEPIme (MAXIPIME) 2 g in dextrose 5 % 50 mL IVPB  Status:  Discontinued    Comments:  Start after cultures drawn   2 g 100 mL/hr over  30 Minutes Intravenous  Once 10/10/16 1151 10/10/16 1307   10/10/16 1200  vancomycin (VANCOCIN) IVPB 1000 mg/200 mL premix  Status:  Discontinued    Comments:  Start after cultures drawn   1,000 mg 200 mL/hr over 60 Minutes Intravenous  Once 10/10/16 1151 10/10/16 1308      Objective: Physical Exam: Vitals:     10/12/16 0144 10/12/16 0554 10/12/16 1120 10/12/16 1343  BP: (!) 125/59 (!) 146/60 130/68 (!) 147/85  Pulse: 87 94 78 98  Resp: 18 18 18 20   Temp: 98 F (36.7 C) 98 F (36.7 C) 97.2 F (36.2 C) 98.4 F (36.9 C)  TempSrc: Axillary Axillary Axillary Axillary  SpO2: 99% 97% 98% 99%  Weight:      Height:        Intake/Output Summary (Last 24 hours) at 10/12/16 1452 Last data filed at 10/12/16 1252  Gross per 24 hour  Intake          1316.25 ml  Output              650 ml  Net           666.25 ml   Filed Weights   10/10/16 1126  Weight: 81.3 kg (179 lb 3.7 oz)   General: Alert, Awake and Oriented to Person. Appear in mild distress, affect appropriate Eyes: PERRL, Conjunctiva normal ENT: Oral Mucosa clear moist. Neck: no JVD, no Abnormal Mass Or lumps Cardiovascular: S1 and S2 Present, aortic systolic Murmur, Respiratory: Bilateral Air entry equal and Decreased, no use of accessory muscle, bilateral Crackles, Occasional wheezes Abdomen: Bowel Sound present, Soft and no tenderness Skin: no redness, no Rash, no induration Extremities: no Pedal edema, no calf tenderness Neurologic: Dysarthria, confusion and Left-sided weakness  Data Reviewed: CBC:  Recent Labs Lab 10/07/16 0451 10/10/16 1040 10/10/16 1050 10/11/16 0257 10/12/16 0632  WBC 11.5* 19.4*  --  14.1* 16.1*  NEUTROABS  --  15.7*  --   --  13.2*  HGB 12.4* 12.5* 12.2* 11.2* 11.4*  HCT 35.2* 37.0* 36.0* 34.1* 34.0*  MCV 88.9 90.9  --  91.9 90.9  PLT 216 283  --  281 [id number]   Basic Metabolic Panel:  Recent Labs Lab 10/07/16 0451 10/10/16 1040 10/10/16 1050 10/11/16 0257 10/11/16 2149 10/12/16 0632  NA 139 142 142 144 146* 146*  K 3.6 3.9 3.8 4.2 3.4* 3.5  CL 106 106 103 108 111 115*  CO2 26 27  --  27 25 24   GLUCOSE 106* 250* 247* 221* 86 77  BUN 23* 32* 32* 31* 29* 30*  CREATININE 1.31* 1.77* 1.70* 1.68* 1.54* 1.48*  CALCIUM 8.4* 8.7*  --  8.5* 8.4* 8.3*  MG  --   --   --   --  2.5* 2.8*   Liver  Function Tests:  Recent Labs Lab 10/10/16 1040 10/12/16 0632  AST 47* 39  ALT 45 38  ALKPHOS 67 80  BILITOT 1.1 0.9  PROT 6.8 6.4*  ALBUMIN 2.7* 2.3*   No results for input(s): LIPASE, AMYLASE in the last 168 hours. No results for input(s): AMMONIA in the last 168 hours. Coagulation Profile:  Recent Labs Lab 10/10/16 1040  INR 1.70   Cardiac Enzymes:  Recent Labs Lab 10/10/16 1945 10/11/16 0257 10/11/16 2149  TROPONINI 0.51* 0.46* 0.30*   BNP (last 3 results) No results for input(s): PROBNP in the last 8760 hours.  CBG:  Recent Labs Lab 10/11/16 1122 10/11/16 1640 10/11/16 2147 10/12/16 0554 10/12/16 1143  GLUCAP 184* 125* 79 76 117*   Studies: Dg Swallowing Func-speech Pathology  Result Date: 10/12/2016 Objective Swallowing Evaluation: Type of Study: MBS-Modified Barium Swallow Study Patient Details Name: Gustavo Heeney MRN: [id number] Date of Birth: 1929/10/05 Today's Date: 10/12/2016 Time: SLP Start Time (ACUTE ONLY): 0943-SLP Stop Time (ACUTE ONLY): 1003 SLP Time Calculation (min) (ACUTE ONLY): 20 min Past Medical History: Past Medical History: Diagnosis Date . Arthralgia   left knee/patella/tibia/fibula . BCC (basal cell carcinoma)  . Coronary artery disease  . CVA (cerebral infarction)   left sided . Dysarthria  . Erectile dysfunction  . Essential hypertension  . GERD (gastroesophageal reflux disease)  . Hyperlipemia  . MI (myocardial infarction) 01/2011 . Prostatitis  . Spinal stenosis  . TIA (transient ischemic attack)  . Type 2 diabetes mellitus ([place])  Past Surgical History: Past Surgical History: Procedure Laterality Date . CORONARY ANGIOPLASTY WITH STENT PLACEMENT  2007 . KNEE SURGERY Left  . LUMBAR LAMINECTOMY   HPI: 82 y.o.malewith medical history significant for CAD, CVA, hypertension, hyperlipidemia, MI, diabetes, presents to the emergency department with complaint of slurred speech. Initial evaluation concerning for TIA/stroke and/or possible pneumonia. Pt  with recent admission for PNA. His daughter says that he has mild dysphagia from prior CVAs but is on a regular diet and thin liquids with use of aspiration precautions.  Subjective: pt alert, dysarthric, denies difficulty swallowing Assessment / Plan / Recommendation CHL IP CLINICAL IMPRESSIONS 10/12/2016 Therapy Diagnosis Moderate pharyngeal phase dysphagia;Moderate oral phase dysphagia Clinical Impression Pt has a moderate oropharyngeal dysphagia, due to impaired timing and motor weakness. He has oral holding and decreased bolus cohesion, but responds to Mod cues to initiate posterior propulsion. His delayed timing and generalized pharyngeal weakness results in deep penetration to the vocal folds with spoonfuls of nectar thick liquids that elicits a weak cough, ineffective at clearing any penetrates. Even with cues for more force, he is unable to sufficiently clear penetrates. Moderate residue remains in the pharynx but primarily at the valleculae, and increases in volume as the boluses get thicker. SLP provided Mod cues for use of chin tuck to reduce residuals and better contain boluses in the valleculae to prevent further penetration from occuring. Recommend Dys 1 diet and honey thick liquids by spoon only with need for full supervision to ensure use of chin tuck. Impact on safety and function Mild aspiration risk;Moderate aspiration risk   CHL IP TREATMENT RECOMMENDATION 10/12/2016 Treatment Recommendations Therapy as outlined in treatment plan below   Prognosis 10/12/2016 Prognosis for Safe Diet Advancement Fair Barriers to Reach Goals Other (Comment) Barriers/Prognosis Comment -- CHL IP DIET RECOMMENDATION 10/12/2016 SLP Diet Recommendations Dysphagia 1 (Puree) solids;Honey thick liquids Liquid Administration via Spoon Medication Administration Crushed with puree Compensations Minimize environmental distractions;Slow rate;Small sips/bites;Follow solids with liquid;Chin tuck Postural Changes Seated upright at 90  degrees;Remain semi-upright after after feeds/meals (Comment)   CHL IP OTHER RECOMMENDATIONS 10/12/2016 Recommended Consults -- Oral Care Recommendations Oral care BID Other Recommendations Order thickener from pharmacy;Prohibited food (jello, ice cream, thin soups);Remove water pitcher;Have oral suction available   CHL IP FOLLOW UP RECOMMENDATIONS 10/12/2016 Follow up Recommendations Skilled Nursing facility   [facility] IP FREQUENCY AND DURATION 10/12/2016 Speech Therapy Frequency (ACUTE ONLY) min 2x/week Treatment Duration 2 weeks      CHL IP ORAL PHASE 10/12/2016 Oral Phase Impaired Oral - Pudding Teaspoon -- Oral - Pudding Cup -- Oral - Honey Teaspoon Holding of bolus;Decreased bolus cohesion Oral - Honey Cup -- Oral - Nectar Teaspoon Holding of bolus;Decreased  bolus cohesion Oral - Nectar Cup -- Oral - Nectar Straw -- Oral - Thin Teaspoon -- Oral - Thin Cup -- Oral - Thin Straw -- Oral - Puree Holding of bolus;Decreased bolus cohesion Oral - Mech Soft -- Oral - Regular -- Oral - Multi-Consistency -- Oral - Pill -- Oral Phase - Comment --  CHL IP PHARYNGEAL PHASE 10/12/2016 Pharyngeal Phase Impaired Pharyngeal- Pudding Teaspoon -- Pharyngeal -- Pharyngeal- Pudding Cup -- Pharyngeal -- Pharyngeal- Honey Teaspoon Delayed swallow initiation-vallecula;Reduced anterior laryngeal mobility;Reduced laryngeal elevation;Reduced epiglottic inversion;Reduced tongue base retraction;Pharyngeal residue - valleculae;Compensatory strategies attempted (with notebox) Pharyngeal -- Pharyngeal- Honey Cup -- Pharyngeal -- Pharyngeal- Nectar Teaspoon Reduced anterior laryngeal mobility;Reduced laryngeal elevation;Reduced epiglottic inversion;Reduced tongue base retraction;Pharyngeal residue - valleculae;Delayed swallow initiation-pyriform sinuses;Penetration/Aspiration before swallow Pharyngeal Material enters airway, CONTACTS cords and not ejected out Pharyngeal- Nectar Cup -- Pharyngeal -- Pharyngeal- Nectar Straw -- Pharyngeal -- Pharyngeal-  Thin Teaspoon -- Pharyngeal -- Pharyngeal- Thin Cup -- Pharyngeal -- Pharyngeal- Thin Straw -- Pharyngeal -- Pharyngeal- Puree Delayed swallow initiation-vallecula;Reduced anterior laryngeal mobility;Reduced laryngeal elevation;Reduced epiglottic inversion;Reduced tongue base retraction;Pharyngeal residue - valleculae;Compensatory strategies attempted (with notebox) Pharyngeal -- Pharyngeal- Mechanical Soft -- Pharyngeal -- Pharyngeal- Regular -- Pharyngeal -- Pharyngeal- Multi-consistency -- Pharyngeal -- Pharyngeal- Pill -- Pharyngeal -- Pharyngeal Comment --  CHL IP CERVICAL ESOPHAGEAL PHASE 10/12/2016 Cervical Esophageal Phase WFL Pudding Teaspoon -- Pudding Cup -- Honey Teaspoon -- Honey Cup -- Nectar Teaspoon -- Nectar Cup -- Nectar Straw -- Thin Teaspoon -- Thin Cup -- Thin Straw -- Puree -- Mechanical Soft -- Regular -- Multi-consistency -- Pill -- Cervical Esophageal Comment -- No flowsheet data found. Germain Osgood 10/12/2016, 10:42 AM  Germain Osgood, M.A. CCC-SLP [phone]               Scheduled Meds: . azithromycin  500 mg Oral Daily  . ceFEPime (MAXIPIME) IV  2 g Intravenous Q24H  . digoxin  0.25 mg Oral Daily  . insulin aspart  0-15 Units Subcutaneous TID WC  . insulin aspart  0-5 Units Subcutaneous QHS  . insulin glargine  40 Units Subcutaneous Daily  . Rivaroxaban  15 mg Oral Q supper  . rosuvastatin  40 mg Oral QHS   Continuous Infusions: PRN Meds: acetaminophen **OR** acetaminophen (TYLENOL) oral liquid 160 mg/5 mL **OR** acetaminophen, benzonatate, ipratropium-albuterol, RESOURCE THICKENUP CLEAR  Time spent: 30 minutes  Author: Berle Mull, MD [facility] Pager: [phone] 10/12/2016 2:52 PM  If 7PM-7AM, please contact night-coverage at [email or web address], password [email or web address]

## 2016-10-12 NOTE — Progress Notes (Signed)
Modified Barium Swallow Progress Note  Patient Details  Name: Shaquez Gikas MRN: [id number] Date of Birth: 1930-07-08  Today's Date: 10/12/2016  Modified Barium Swallow completed.  Full report located under Chart Review in the Imaging Section.  Brief recommendations include the following:  Clinical Impression  Pt has a moderate oropharyngeal dysphagia, due to impaired timing and motor weakness. He has oral holding and decreased bolus cohesion, but responds to Mod cues to initiate posterior propulsion. His delayed timing and generalized pharyngeal weakness results in deep penetration to the vocal folds with spoonfuls of nectar thick liquids that elicits a weak cough, ineffective at clearing any penetrates. Even with cues for more force, he is unable to sufficiently clear penetrates. Moderate residue remains in the pharynx but primarily at the valleculae, and increases in volume as the boluses get thicker. SLP provided Mod cues for use of chin tuck to reduce residuals and better contain boluses in the valleculae to prevent further penetration from occuring. Recommend Dys 1 diet and honey thick liquids by spoon only with need for full supervision to ensure use of chin tuck.   Swallow Evaluation Recommendations       SLP Diet Recommendations: Dysphagia 1 (Puree) solids;Honey thick liquids   Liquid Administration via: Spoon   Medication Administration: Crushed with puree   Supervision: Staff to assist with self feeding;Full supervision/cueing for compensatory strategies   Compensations: Minimize environmental distractions;Slow rate;Small sips/bites;Follow solids with liquid;Chin tuck   Postural Changes: Seated upright at 90 degrees;Remain semi-upright after after feeds/meals (Comment)   Oral Care Recommendations: Oral care BID   Other Recommendations: Order thickener from pharmacy;Prohibited food (jello, ice cream, thin soups);Remove water pitcher;Have oral suction  available    Germain Osgood 10/12/2016,10:42 AM   Germain Osgood, M.A. CCC-SLP [phone]

## 2016-10-12 NOTE — NC FL2 (Signed)
[place] LEVEL OF CARE SCREENING TOOL     IDENTIFICATION  Patient Name: Robert Rojas Birthdate: 1929-12-01 Sex: male Admission Date (Current Location): 10/10/2016  [facility] and [place] Number:  Herbalist and Address:  [facility], [place] [address], [place],  [address]      Provider Number: [id number]  Attending Physician Name and Address:  Lavina Hamman, MD  Relative Name and Phone Number:       Current Level of Care: Hospital Recommended Level of Care: [place] Prior Approval Number:    Date Approved/Denied:   PASRR Number: [id number])  Discharge Plan: SNF    Current Diagnoses: Patient Active Problem List   Diagnosis Date Noted  . Weakness 10/10/2016  . Stroke-like symptoms 10/10/2016  . Elevated troponin 10/10/2016  . Abnormal EKG 10/10/2016  . Acute kidney injury ([place]) 10/10/2016  . Stenosis of middle cerebral artery 10/10/2016  . Stroke-like symptom 10/10/2016  . Type 2 diabetes mellitus ([place])   . Essential hypertension   . Coronary artery disease   . Dysarthria   . AKI (acute kidney injury) ([place])   . Pneumonia 10/04/2016  . Syncope 07/28/2015  . Erectile dysfunction of organic origin 05/05/2015    Orientation RESPIRATION BLADDER Height & Weight     Self, Place  O2 (Nasal Cannula; 2L) Incontinent Weight: 179 lb 3.7 oz (81.3 kg) Height:  5\' 10"  (177.8 cm)  BEHAVIORAL SYMPTOMS/MOOD NEUROLOGICAL BOWEL NUTRITION STATUS      Incontinent  (Please see d/c summary)  AMBULATORY STATUS COMMUNICATION OF NEEDS Skin   Extensive Assist Verbally Normal                       Personal Care Assistance Level of Assistance  Bathing, Feeding, Dressing Bathing Assistance: Maximum assistance Feeding assistance: Limited assistance Dressing Assistance: Maximum assistance     Functional Limitations Info  Sight, Hearing, Speech Sight Info: Adequate Hearing Info: Adequate Speech Info: Adequate     SPECIAL CARE FACTORS FREQUENCY  PT (By licensed PT), OT (By licensed OT)     PT Frequency: min 2x week OT Frequency: min 2x week            Contractures Contractures Info: Not present    Additional Factors Info  Code Status, Allergies Code Status Info: DNR Allergies Info: Lipitor Atorvastatin, Lisinopril   Insulin Sliding Scale Info: .insulin aspart, 0-15 Units, Subcutaneous,TID WC .insulin aspart, 0-5 Units, Subcutaneous, QHS .insulin glargine, 40 Units, Subcutaneous, Daily       Current Medications (10/12/2016):  This is the current hospital active medication list Current Facility-Administered Medications  Medication Dose Route Frequency Provider Last Rate Last Dose  . acetaminophen (TYLENOL) tablet 650 mg  650 mg Oral Q4H PRN Radene Gunning, NP       Or  . acetaminophen (TYLENOL) solution 650 mg  650 mg Per Tube Q4H PRN Radene Gunning, NP       Or  . acetaminophen (TYLENOL) suppository 650 mg  650 mg Rectal Q4H PRN Radene Gunning, NP   650 mg at 10/12/16 0136  . azithromycin (ZITHROMAX) tablet 500 mg  500 mg Oral Daily Lavina Hamman, MD   500 mg at 10/12/16 1044  . benzonatate (TESSALON) capsule 100 mg  100 mg Oral BID PRN Lezlie Octave Black, NP      . ceFEPIme (MAXIPIME) 2 g in dextrose 5 % 50 mL IVPB  2 g Intravenous Q24H Georgina Snell  Drucilla Chalet, RPH 100 mL/hr at 10/12/16 1301 2 g at 10/12/16 1301  . digoxin (LANOXIN) tablet 0.25 mg  0.25 mg Oral Daily Lezlie Octave Black, NP   0.25 mg at 10/12/16 1044  . insulin aspart (novoLOG) injection 0-15 Units  0-15 Units Subcutaneous TID WC Radene Gunning, NP   2 Units at 10/11/16 1711  . insulin aspart (novoLOG) injection 0-5 Units  0-5 Units Subcutaneous QHS Lezlie Octave Black, NP      . insulin glargine (LANTUS) injection 40 Units  40 Units Subcutaneous Daily Radene Gunning, NP   40 Units at 10/12/16 1046  . ipratropium-albuterol (DUONEB) 0.5-2.5 (3) MG/3ML nebulizer solution 3 mL  3 mL Nebulization Q4H PRN Lajean Saver, MD      . RESOURCE THICKENUP CLEAR    Oral PRN Lavina Hamman, MD      . Rivaroxaban Alveda Reasons) tablet 15 mg  15 mg Oral Q supper Radene Gunning, NP      . rosuvastatin (CRESTOR) tablet 40 mg  40 mg Oral QHS Radene Gunning, NP         Discharge Medications: Please see discharge summary for a list of discharge medications.  Relevant Imaging Results:  Relevant Lab Results:   Additional Information SSN:[id number]  Alla German, LCSW

## 2016-10-13 LAB — CBC WITH DIFFERENTIAL/PLATELET
Basophils Absolute: 0 10*3/uL (ref 0.0–0.1)
Basophils Relative: 0 %
EOS ABS: 0.3 10*3/uL (ref 0.0–0.7)
EOS PCT: 2 %
HCT: 34.6 % — ABNORMAL LOW (ref 39.0–52.0)
Hemoglobin: 11.3 g/dL — ABNORMAL LOW (ref 13.0–17.0)
LYMPHS ABS: 1.3 10*3/uL (ref 0.7–4.0)
Lymphocytes Relative: 9 %
MCH: 30 pg (ref 26.0–34.0)
MCHC: 32.7 g/dL (ref 30.0–36.0)
MCV: 91.8 fL (ref 78.0–100.0)
MONOS PCT: 6 %
Monocytes Absolute: 0.9 10*3/uL (ref 0.1–1.0)
Neutro Abs: 12.1 10*3/uL — ABNORMAL HIGH (ref 1.7–7.7)
Neutrophils Relative %: 83 %
PLATELETS: 298 10*3/uL (ref 150–400)
RBC: 3.77 MIL/uL — ABNORMAL LOW (ref 4.22–5.81)
RDW: 14.3 % (ref 11.5–15.5)
WBC: 14.5 10*3/uL — ABNORMAL HIGH (ref 4.0–10.5)

## 2016-10-13 LAB — COMPREHENSIVE METABOLIC PANEL
ALT: 35 U/L (ref 17–63)
ANION GAP: 8 (ref 5–15)
AST: 35 U/L (ref 15–41)
Albumin: 2.5 g/dL — ABNORMAL LOW (ref 3.5–5.0)
Alkaline Phosphatase: 84 U/L (ref 38–126)
BUN: 31 mg/dL — ABNORMAL HIGH (ref 6–20)
CALCIUM: 8.5 mg/dL — AB (ref 8.9–10.3)
CHLORIDE: 115 mmol/L — AB (ref 101–111)
CO2: 23 mmol/L (ref 22–32)
Creatinine, Ser: 1.59 mg/dL — ABNORMAL HIGH (ref 0.61–1.24)
GFR calc non Af Amer: 38 mL/min — ABNORMAL LOW (ref >60)
GFR, EST AFRICAN AMERICAN: 44 mL/min — AB (ref >60)
Glucose, Bld: 188 mg/dL — ABNORMAL HIGH (ref 65–99)
POTASSIUM: 3.6 mmol/L (ref 3.5–5.1)
SODIUM: 146 mmol/L — AB (ref 135–145)
Total Bilirubin: 0.7 mg/dL (ref 0.3–1.2)
Total Protein: 6.6 g/dL (ref 6.5–8.1)

## 2016-10-13 LAB — PROTIME-INR
INR: 2.1
PROTHROMBIN TIME: 23.9 s — AB (ref 11.4–15.2)

## 2016-10-13 LAB — MAGNESIUM: MAGNESIUM: 2.8 mg/dL — AB (ref 1.7–2.4)

## 2016-10-13 LAB — GLUCOSE, CAPILLARY
Glucose-Capillary: 174 mg/dL — ABNORMAL HIGH (ref 65–99)
Glucose-Capillary: 245 mg/dL — ABNORMAL HIGH (ref 65–99)

## 2016-10-13 LAB — LACTIC ACID, PLASMA: Lactic Acid, Venous: 1.1 mmol/L (ref 0.5–1.9)

## 2016-10-13 MED ORDER — BENZONATATE 100 MG PO CAPS: 100.0000 mg | ORAL_CAPSULE | Freq: Two times a day (BID) | ORAL | 0 refills | Status: AC | PRN

## 2016-10-13 MED ORDER — POTASSIUM CHLORIDE CRYS ER 10 MEQ PO TBCR: 10.0000 meq | EXTENDED_RELEASE_TABLET | Freq: Every day | ORAL | 0 refills | Status: AC

## 2016-10-13 MED ORDER — CARVEDILOL 3.125 MG PO TABS: 3.1250 mg | ORAL_TABLET | Freq: Two times a day (BID) | ORAL | 0 refills | Status: AC

## 2016-10-13 MED ORDER — FUROSEMIDE 20 MG PO TABS: 20.0000 mg | ORAL_TABLET | Freq: Every day | ORAL | 0 refills | Status: AC

## 2016-10-13 MED ORDER — AMOXICILLIN-POT CLAVULANATE 500-125 MG PO TABS: 1.0000 | ORAL_TABLET | Freq: Three times a day (TID) | ORAL | 0 refills | Status: AC

## 2016-10-13 MED ORDER — AZITHROMYCIN 500 MG PO TABS: 500.0000 mg | ORAL_TABLET | Freq: Every day | ORAL | 0 refills | Status: DC

## 2016-10-13 MED ORDER — RESOURCE THICKENUP CLEAR PO POWD: 1.0000 g | ORAL | 0 refills | Status: AC | PRN

## 2016-10-13 NOTE — Care Management Note (Signed)
Case Management Note  Patient Details  Name: Robert Rojas MRN: [id number] Date of Birth: 1930/03/17  Subjective/Objective:                    Action/Plan: Pt discharging to [facility] today. No further needs per CM.   Expected Discharge Date:  10/13/16               Expected Discharge Plan:  Skilled Nursing Facility  In-House Referral:  Clinical Social Work  Discharge planning Services     Post Acute Care Choice:    Choice offered to:     DME Arranged:    DME Agency:     HH Arranged:    [place] Agency:     Status of Service:  Completed, signed off  If discussed at [employer], dates discussed:    Additional Comments:  Pollie Friar, RN 10/13/2016, 1:59 PM

## 2016-10-13 NOTE — Progress Notes (Signed)
Attempt call report to [facility].

## 2016-10-13 NOTE — Discharge Summary (Signed)
[facility] Discharge Summary   Patient: Robert Rojas [id number]   PCP: Robert Maltese, MD DOB: 16-Dec-1929   Date of admission: 10/10/2016   Date of discharge:  10/13/2016    Discharge Diagnoses:  Principal Problem:   Stroke-like symptoms Active Problems:   Pneumonia   Weakness   Elevated troponin   Abnormal EKG   Essential hypertension   Coronary artery disease   Acute kidney injury ([place])   Stenosis of middle cerebral artery   Stroke-like symptom   Dysarthria   AKI (acute kidney injury) ([place])   Admitted From: SNF Disposition:  SNF  Recommendations for Outpatient Follow-up:  1. Please follow up with PCP in 1 week 2. Pt will need speech therapy at the SNF   Follow-up [place], MD. Schedule an appointment as soon as possible for a visit in 1 week(s).   Specialty:  Internal Medicine Contact information: [address] [place] [address] [phone]          Diet recommendation: Dysphagia 1 (Puree) solids;Honey thick liquids  Liquid Administration via: Spoon  Medication Administration: Crushed with puree  Supervision: Staff to assist with self feeding;Full supervision/cueing for compensatory strategies  Compensations: Minimize environmental distractions;Slow rate;Small sips/bites;Follow solids with liquid;Chin tuck  Postural Changes: Seated upright at 90 degrees;Remain semi-upright after after feeds/meals (Comment)  Oral Care Recommendations: Oral care BID  Other Recommendations: Order thickener from pharmacy;Prohibited food (jello, ice cream, thin soups);Remove water pitcher;Have oral suction available  Activity: The patient is advised to gradually reintroduce usual activities.  Discharge Condition: good  Code Status: DNR DNI  History of present illness: As per the H and P dictated on admission, "Robert Rojas is a 83 y.o. male with medical history significant for CAD, CVA, hypertension, hyperlipidemia, MI, diabetes, presents  to the emergency department with complaint of slurred speech. Initial evaluation concerning for TIA/stroke and/or possible pneumonia.  Formation is obtained from the chart and the patient noting that information from patient may be unreliable as it history of mild dementia. Chart review indicates he was sent to the emergency department by the facility today when he appeared to be generally weak and have some slurred speech. It is unclear when he was last normal. Patient denies any headache visual disturbances numbness tingling of extremities. He does endorse on "ache" all over. He denies any chest pain palpitation shortness of breath lower extremity edema. He denies abdominal pain nausea vomiting diarrhea constipation melena."  Hospital Course:   Summary of his active problems in the hospital is as following. 1. Stroke like symptoms.  recrudescence of prior stroke symptoms Facility reported worsening slurred speech with generalized weakness. History of CVA residual left-sided weakness.  CT of the head no acute bleed or stroke.  MRI of the brain MRA no acute hemorrhage or visible infarct. Does reveal MCA stenosis.  Neurology considers this to be a recrudescence of his chronic symptoms. Speech therapy recommends dysphagia 1 diet LDL 11. Continue aspirin PTOT Recommends SNF, social worker working on case.    2. HCAP. chest xray with bronchitis, leukocytosis, exam with congestion.  No growth till date blood cultures Negative strep pneumo urine antigen negative influenza panel Change cefepime to Oral Augmentin to cover for aspiration. Finish a total of 7 day treatment course from improvement in WBC.  3. Elevated troponin/abnormal EKG.  demand ischemia in the setting of pneumonia. Trend not supporting ACS diagnosis, echocardiogram does not show any new wall motion abnormalities, ejection fraction improved from 30% to 40%.  Home meds include aspirin, Coreg, Lanoxin, Cardizem, Crestor  Patient  denies any chest pain.  Troponin trending down, Continue statin, aspirin.  4. A. Fib.Mali score 4.  On xarelto.  Home meds include coreg, dig, diltiazem.  Rate controlled continue Coreg, continue to hold Cardizem on discharge.  5. Diabetes. Serum glucose 150 on admission. Home medications include Lantus 40 units as well as Januvia and sliding scale. - hemoglobin A1c 7.2 -sliding scale insulin for optimal control, continue home regimen  6. Acute kidney injury. Likely related to decreased oral intake in the setting of above. Creatinine 1.7 on admission chart review indicates baseline closer to 1.2 Hold nephrotoxins  7. HTN Frequent PVCs, suspected chronic systolic CHF. Resume coreg,  Stop cardizem,  Reduce Lasix from 40-20 mg daily.  All other chronic medical condition were stable during the hospitalization.  Patient was seen by physical therapy, who recommended SNF, which was arranged by Education officer, museum and case Freight forwarder. On the day of the discharge the patient's vitals were stable, and no other acute medical condition were reported by patient. the patient was felt safe to be discharge at [facility].  Procedures and Results:  echocardiogram  Study Conclusions  - Left ventricle: Wall thickness was increased in a pattern of mild   LVH. Systolic function was mildly to moderately reduced. The   estimated ejection fraction was in the range of 40% to 45%. - Mitral valve: There was mild regurgitation.  Consultations:  none  DISCHARGE MEDICATION: Current Discharge Medication List    START taking these medications   Details  amoxicillin-clavulanate (AUGMENTIN) 500-125 MG tablet Take 1 tablet (500 mg total) by mouth every 8 (eight) hours. Qty: 15 tablet, Refills: 0    benzonatate (TESSALON) 100 MG capsule Take 1 capsule (100 mg total) by mouth 2 (two) times daily as needed for cough. Qty: 20 capsule, Refills: 0    Maltodextrin-Xanthan Gum (RESOURCE THICKENUP CLEAR) POWD Take 1 g  by mouth as needed. Qty: 125 g, Refills: 0      CONTINUE these medications which have CHANGED   Details  carvedilol (COREG) 3.125 MG tablet Take 1 tablet (3.125 mg total) by mouth 2 (two) times daily with a meal. Qty: 60 tablet, Refills: 0    furosemide (LASIX) 20 MG tablet Take 1 tablet (20 mg total) by mouth daily. Qty: 30 tablet, Refills: 0    potassium chloride (K-DUR,KLOR-CON) 10 MEQ tablet Take 1 tablet (10 mEq total) by mouth daily. Qty: 30 tablet, Refills: 0      CONTINUE these medications which have NOT CHANGED   Details  aspirin 81 MG tablet Take 81 mg by mouth daily.    cetirizine (ZYRTEC) 10 MG tablet TAKE 1 TABLET BY MOUTH AT BEDTIME FOR NASAL DRIP Refills: 5    digoxin (LANOXIN) 0.25 MG tablet Take 0.25 mg by mouth daily.    HUMALOG KWIKPEN 100 UNIT/ML KiwkPen USE AS DIRECTED FOR SLIDING SCALE FOUR TIMES DAILY. MAX OF 48 UNITS PER DAY Refills: 3    JANUVIA 100 MG tablet Take 100 mg by mouth daily. Refills: 5    LANTUS SOLOSTAR 100 UNIT/ML Solostar Pen Inject 40 Units into the skin daily.     rosuvastatin (CRESTOR) 40 MG tablet Take 40 mg by mouth at bedtime.    XARELTO 15 MG TABS tablet Take 1 tablet by mouth daily. Refills: 2      STOP taking these medications     diltiazem (CARDIZEM CD) 120 MG 24 hr capsule  lisinopril (PRINIVIL,ZESTRIL) 2.5 MG tablet        Allergies  Allergen Reactions  . Lipitor [Atorvastatin]     Unknown   . Lisinopril     unknown   Discharge Instructions    DIET - DYS 1    Complete by:  As directed    Fluid consistency:  Honey Thick   Dysphagia 1 (Puree) solids;Honey thick liquids Liquid Administration via: Spoon Medication Administration: Crushed with puree Supervision: Staff to assist with self feeding;Full supervision/cueing for compensatory strategies Compensations: Minimize environmental distractions;Slow rate;Small sips/bites;Follow solids with liquid;Chin tuck Postural Changes: Seated upright at 90  degrees;Remain semi-upright after after feeds/meals (Comment) Oral Care Recommendations: Oral care BID Other Recommendations: Order thickener from pharmacy;Prohibited food (jello, ice cream, thin soups);Remove water pitcher;Have oral suction available   Diet - low sodium heart healthy    Complete by:  As directed    Diet Carb Modified    Complete by:  As directed    Discharge instructions    Complete by:  As directed    It is important that you read following instructions as well as go over your medication list with RN to help you understand your care after this hospitalization.  Discharge Instructions: Please follow-up with PCP in one week  Please request your primary care physician to go over all Hospital Tests and Procedure/Radiological results at the follow up,  Please get all Hospital records sent to your PCP by signing hospital release before you go home.   Do not take more than prescribed Pain, Sleep and Anxiety Medications. You were cared for by a hospitalist during your hospital stay. If you have any questions about your discharge medications or the care you received while you were in the hospital after you are discharged, you can call the unit and ask to speak with the hospitalist on call if the hospitalist that took care of you is not available.  Once you are discharged, your primary care physician will handle any further medical issues. Please note that NO REFILLS for any discharge medications will be authorized once you are discharged, as it is imperative that you return to your primary care physician (or establish a relationship with a primary care physician if you do not have one) for your aftercare needs so that they can reassess your need for medications and monitor your lab values. You Must read complete instructions/literature along with all the possible adverse reactions/side effects for all the Medicines you take and that have been prescribed to you. Take any new Medicines  after you have completely understood and accept all the possible adverse reactions/side effects. Wear Seat belts while driving.   Increase activity slowly    Complete by:  As directed      Discharge Exam: Filed Weights   10/10/16 1126  Weight: 81.3 kg (179 lb 3.7 oz)   Vitals:   10/13/16 0906 10/13/16 1017  BP:  136/64  Pulse: 74 85  Resp:  20  Temp:  97.1 F (36.2 C)   General: Appear in mild distress, no Rash; Oral Mucosa moist. Cardiovascular: S1 and S2 Present, no Murmur, no JVD Respiratory: Bilateral Air entry present and basal Crackles, no wheezes Abdomen: Bowel Sound present, Soft and no tenderness Extremities: no Pedal edema, no calf tenderness Neurology: Grossly no focal neuro deficit.  The results of significant diagnostics from this hospitalization (including imaging, microbiology, ancillary and laboratory) are listed below for reference.    Significant Diagnostic Studies: Dg Chest 2 View  Result  Date: 10/04/2016 CLINICAL DATA:  chest pain, weakness EXAM: CHEST  2 VIEW COMPARISON:  07/27/2015 FINDINGS: Patchy airspace opacities have developed throughout the right lung. Minimal patchy densities at the left base. Normal heart size. No pneumothorax. No pleural effusion. Stable mid-level thoracic compression deformity. IMPRESSION: Patchy airspace opacities right greater than left worrisome for bronchopneumonia. Followup PA and lateral chest X-ray is recommended in 3-4 weeks following trial of antibiotic therapy to ensure resolution and exclude underlying malignancy. Electronically Signed   By: Marybelle Killings M.D.   On: 10/04/2016 12:03   Mr Jodene Nam Head Wo Contrast  Result Date: 10/10/2016 CLINICAL DATA:  Acute presentation with left-sided weakness and slurred speech. EXAM: MRI HEAD WITHOUT AND WITH CONTRAST MRA HEAD WITHOUT CONTRAST MRA NECK WITHOUT AND WITH CONTRAST TECHNIQUE: Multiplanar, multiecho pulse sequences of the brain and surrounding structures were obtained without  and with intravenous contrast. Angiographic images of the Circle of Willis were obtained using MRA technique without intravenous contrast. Angiographic images of the neck were obtained using MRA technique without and with intravenous contrast. Carotid stenosis measurements (when applicable) are obtained utilizing NASCET criteria, using the distal internal carotid diameter as the denominator. CONTRAST:  10 cc MultiHance COMPARISON:  Head CT same day FINDINGS: MRI HEAD FINDINGS Brain: Diffusion imaging does not show any acute or subacute infarction. There chronic small-vessel ischemic changes affecting the pons. There is chronic cerebellar atrophy. Cerebral hemispheres show chronic widespread atrophy with chronic small-vessel ischemic changes throughout the deep white matter. Old infarction in the basal gangliar regions more extensive on the right than the left. No large vessel territory infarction. No evidence of neoplastic mass lesion, acute hemorrhage, hydrocephalus or extra-axial collection. Residual blood products in the region of right basal ganglia infarction. After contrast administration, no abnormal enhancement occurs. Vascular: Major vessels at the base of the brain show flow,, with the exception of abnormal appearing flow in the right vertebral artery. Skull and upper cervical spine: Negative Sinuses/Orbits: Mild mucosal inflammation of the sinuses. Small mastoid effusions. Other: None significant MRA HEAD FINDINGS Both internal carotid arteries are widely patent through the skullbase. The anterior and middle cerebral vessels are patent bilaterally, but there are serial stenoses which could place the patient at risk of MCA infarction on either side. This appears slightly more pronounced on the right or serial stenoses are in the region of 50-70%. Stenoses in the left appear more in the region of 30-50%. Left vertebral artery is patent. Atherosclerotic irregularity of the distal vertebral artery with  stenosis estimated at 30-50%. Right vertebral artery is a small vessel which is either occluded or shows minimal flow. See neck exam. The basilar artery shows atherosclerotic irregularity and narrowing. Distal basilar stenosis is estimated at 50-70%. Flow is present within both superior cerebellar arteries. Limited if any left PCA flow is demonstrated. MRA NECK FINDINGS Image volume unfortunately excludes the left subclavian artery proximally as well as excludes portion of the proximal left vertebral. Common origin of the innominate artery and left common carotid artery is widely patent. Right common carotid artery widely patent to the bifurcation. Mild atherosclerotic disease in the right ICA bulb, but no stenosis. Cervical ICA widely patent. Left common carotid artery widely patent to the bifurcation. Carotid bifurcation region widely patent. Cervical ICA widely patent. Right vertebral artery origin difficult to evaluate precisely because of small size. This is a small vessel which shows antegrade flow, probably with a small component reaching the basilar. Left vertebral artery origin is patent. Tortuous vessel beyond that  leaves the imaging volume, but I presume it is widely patent. Beyond that, the left vertebral artery is widely patent through the cervical region. IMPRESSION: No acute infarction by MRI. Extensive old small vessel ischemic changes throughout the brain including old bilateral basal ganglia strokes, larger on the right than the left. Neck MRA. No significant carotid bifurcation disease. Left subclavian artery is not included in the imaging volume. Left vertebral artery origin is patent and the vessel appears to be widely patent through the neck, though a portion of the proximal vertebral artery also leaves the imaging volume. Right vertebral artery is a small diseased vessel, but probably does give some supply to the basilar. Intracranial MRA. Serial stenoses in both the MCA territories, more  severe on the right than the left. Stenoses on the right are probably in the 50-70% range and on the left probably in the 30-50% range. Nonetheless, the patient would be at risk of MCA infarction. Severe atherosclerotic disease in the posterior circulation. Both distal vertebral arteries show narrowing and irregularity. The basilar artery shows severe atherosclerotic narrowing and irregularity including a severe distal stenosis. Diminished flow is demonstrated in the left PCA territory. Electronically Signed   By: Nelson Chimes M.D.   On: 10/10/2016 14:50   Mr Jodene Nam Neck W Wo Contrast  Result Date: 10/10/2016 CLINICAL DATA:  Acute presentation with left-sided weakness and slurred speech. EXAM: MRI HEAD WITHOUT AND WITH CONTRAST MRA HEAD WITHOUT CONTRAST MRA NECK WITHOUT AND WITH CONTRAST TECHNIQUE: Multiplanar, multiecho pulse sequences of the brain and surrounding structures were obtained without and with intravenous contrast. Angiographic images of the Circle of Willis were obtained using MRA technique without intravenous contrast. Angiographic images of the neck were obtained using MRA technique without and with intravenous contrast. Carotid stenosis measurements (when applicable) are obtained utilizing NASCET criteria, using the distal internal carotid diameter as the denominator. CONTRAST:  10 cc MultiHance COMPARISON:  Head CT same day FINDINGS: MRI HEAD FINDINGS Brain: Diffusion imaging does not show any acute or subacute infarction. There chronic small-vessel ischemic changes affecting the pons. There is chronic cerebellar atrophy. Cerebral hemispheres show chronic widespread atrophy with chronic small-vessel ischemic changes throughout the deep white matter. Old infarction in the basal gangliar regions more extensive on the right than the left. No large vessel territory infarction. No evidence of neoplastic mass lesion, acute hemorrhage, hydrocephalus or extra-axial collection. Residual blood products in  the region of right basal ganglia infarction. After contrast administration, no abnormal enhancement occurs. Vascular: Major vessels at the base of the brain show flow,, with the exception of abnormal appearing flow in the right vertebral artery. Skull and upper cervical spine: Negative Sinuses/Orbits: Mild mucosal inflammation of the sinuses. Small mastoid effusions. Other: None significant MRA HEAD FINDINGS Both internal carotid arteries are widely patent through the skullbase. The anterior and middle cerebral vessels are patent bilaterally, but there are serial stenoses which could place the patient at risk of MCA infarction on either side. This appears slightly more pronounced on the right or serial stenoses are in the region of 50-70%. Stenoses in the left appear more in the region of 30-50%. Left vertebral artery is patent. Atherosclerotic irregularity of the distal vertebral artery with stenosis estimated at 30-50%. Right vertebral artery is a small vessel which is either occluded or shows minimal flow. See neck exam. The basilar artery shows atherosclerotic irregularity and narrowing. Distal basilar stenosis is estimated at 50-70%. Flow is present within both superior cerebellar arteries. Limited if any left  PCA flow is demonstrated. MRA NECK FINDINGS Image volume unfortunately excludes the left subclavian artery proximally as well as excludes portion of the proximal left vertebral. Common origin of the innominate artery and left common carotid artery is widely patent. Right common carotid artery widely patent to the bifurcation. Mild atherosclerotic disease in the right ICA bulb, but no stenosis. Cervical ICA widely patent. Left common carotid artery widely patent to the bifurcation. Carotid bifurcation region widely patent. Cervical ICA widely patent. Right vertebral artery origin difficult to evaluate precisely because of small size. This is a small vessel which shows antegrade flow, probably with a small  component reaching the basilar. Left vertebral artery origin is patent. Tortuous vessel beyond that leaves the imaging volume, but I presume it is widely patent. Beyond that, the left vertebral artery is widely patent through the cervical region. IMPRESSION: No acute infarction by MRI. Extensive old small vessel ischemic changes throughout the brain including old bilateral basal ganglia strokes, larger on the right than the left. Neck MRA. No significant carotid bifurcation disease. Left subclavian artery is not included in the imaging volume. Left vertebral artery origin is patent and the vessel appears to be widely patent through the neck, though a portion of the proximal vertebral artery also leaves the imaging volume. Right vertebral artery is a small diseased vessel, but probably does give some supply to the basilar. Intracranial MRA. Serial stenoses in both the MCA territories, more severe on the right than the left. Stenoses on the right are probably in the 50-70% range and on the left probably in the 30-50% range. Nonetheless, the patient would be at risk of MCA infarction. Severe atherosclerotic disease in the posterior circulation. Both distal vertebral arteries show narrowing and irregularity. The basilar artery shows severe atherosclerotic narrowing and irregularity including a severe distal stenosis. Diminished flow is demonstrated in the left PCA territory. Electronically Signed   By: Nelson Chimes M.D.   On: 10/10/2016 14:50   Mr Jeri Cos [id number] Contrast  Result Date: 10/10/2016 CLINICAL DATA:  Acute presentation with left-sided weakness and slurred speech. EXAM: MRI HEAD WITHOUT AND WITH CONTRAST MRA HEAD WITHOUT CONTRAST MRA NECK WITHOUT AND WITH CONTRAST TECHNIQUE: Multiplanar, multiecho pulse sequences of the brain and surrounding structures were obtained without and with intravenous contrast. Angiographic images of the Circle of Willis were obtained using MRA technique without intravenous contrast.  Angiographic images of the neck were obtained using MRA technique without and with intravenous contrast. Carotid stenosis measurements (when applicable) are obtained utilizing NASCET criteria, using the distal internal carotid diameter as the denominator. CONTRAST:  10 cc MultiHance COMPARISON:  Head CT same day FINDINGS: MRI HEAD FINDINGS Brain: Diffusion imaging does not show any acute or subacute infarction. There chronic small-vessel ischemic changes affecting the pons. There is chronic cerebellar atrophy. Cerebral hemispheres show chronic widespread atrophy with chronic small-vessel ischemic changes throughout the deep white matter. Old infarction in the basal gangliar regions more extensive on the right than the left. No large vessel territory infarction. No evidence of neoplastic mass lesion, acute hemorrhage, hydrocephalus or extra-axial collection. Residual blood products in the region of right basal ganglia infarction. After contrast administration, no abnormal enhancement occurs. Vascular: Major vessels at the base of the brain show flow,, with the exception of abnormal appearing flow in the right vertebral artery. Skull and upper cervical spine: Negative Sinuses/Orbits: Mild mucosal inflammation of the sinuses. Small mastoid effusions. Other: None significant MRA HEAD FINDINGS Both internal carotid arteries are widely patent  through the skullbase. The anterior and middle cerebral vessels are patent bilaterally, but there are serial stenoses which could place the patient at risk of MCA infarction on either side. This appears slightly more pronounced on the right or serial stenoses are in the region of 50-70%. Stenoses in the left appear more in the region of 30-50%. Left vertebral artery is patent. Atherosclerotic irregularity of the distal vertebral artery with stenosis estimated at 30-50%. Right vertebral artery is a small vessel which is either occluded or shows minimal flow. See neck exam. The basilar  artery shows atherosclerotic irregularity and narrowing. Distal basilar stenosis is estimated at 50-70%. Flow is present within both superior cerebellar arteries. Limited if any left PCA flow is demonstrated. MRA NECK FINDINGS Image volume unfortunately excludes the left subclavian artery proximally as well as excludes portion of the proximal left vertebral. Common origin of the innominate artery and left common carotid artery is widely patent. Right common carotid artery widely patent to the bifurcation. Mild atherosclerotic disease in the right ICA bulb, but no stenosis. Cervical ICA widely patent. Left common carotid artery widely patent to the bifurcation. Carotid bifurcation region widely patent. Cervical ICA widely patent. Right vertebral artery origin difficult to evaluate precisely because of small size. This is a small vessel which shows antegrade flow, probably with a small component reaching the basilar. Left vertebral artery origin is patent. Tortuous vessel beyond that leaves the imaging volume, but I presume it is widely patent. Beyond that, the left vertebral artery is widely patent through the cervical region. IMPRESSION: No acute infarction by MRI. Extensive old small vessel ischemic changes throughout the brain including old bilateral basal ganglia strokes, larger on the right than the left. Neck MRA. No significant carotid bifurcation disease. Left subclavian artery is not included in the imaging volume. Left vertebral artery origin is patent and the vessel appears to be widely patent through the neck, though a portion of the proximal vertebral artery also leaves the imaging volume. Right vertebral artery is a small diseased vessel, but probably does give some supply to the basilar. Intracranial MRA. Serial stenoses in both the MCA territories, more severe on the right than the left. Stenoses on the right are probably in the 50-70% range and on the left probably in the 30-50% range. Nonetheless,  the patient would be at risk of MCA infarction. Severe atherosclerotic disease in the posterior circulation. Both distal vertebral arteries show narrowing and irregularity. The basilar artery shows severe atherosclerotic narrowing and irregularity including a severe distal stenosis. Diminished flow is demonstrated in the left PCA territory. Electronically Signed   By: Nelson Chimes M.D.   On: 10/10/2016 14:50   Dg Chest Port 1 View  Result Date: 10/10/2016 CLINICAL DATA:  Cough, code stroke. EXAM: PORTABLE CHEST 1 VIEW COMPARISON:  10/04/2016 FINDINGS: Normal cardiac silhouette. There is mild peribronchial thickening centrally. No focal infiltrate. No effusion or edema. No pneumothorax. IMPRESSION: Coarsening of the peribronchial markings centrally may represent bronchitis. No pneumonia identified. Electronically Signed   By: Suzy Bouchard M.D.   On: 10/10/2016 16:37   Dg Swallowing Func-speech Pathology  Result Date: 10/12/2016 Objective Swallowing Evaluation: Type of Study: MBS-Modified Barium Swallow Study Patient Details Name: Dequanta Kuehler MRN: [id number] Date of Birth: October 25, 1929 Today's Date: 10/12/2016 Time: SLP Start Time (ACUTE ONLY): 0943-SLP Stop Time (ACUTE ONLY): 1003 SLP Time Calculation (min) (ACUTE ONLY): 20 min Past Medical History: Past Medical History: Diagnosis Date . Arthralgia   left knee/patella/tibia/fibula . BCC (basal cell  carcinoma)  . Coronary artery disease  . CVA (cerebral infarction)   left sided . Dysarthria  . Erectile dysfunction  . Essential hypertension  . GERD (gastroesophageal reflux disease)  . Hyperlipemia  . MI (myocardial infarction) 01/2011 . Prostatitis  . Spinal stenosis  . TIA (transient ischemic attack)  . Type 2 diabetes mellitus ([place])  Past Surgical History: Past Surgical History: Procedure Laterality Date . CORONARY ANGIOPLASTY WITH STENT PLACEMENT  2007 . KNEE SURGERY Left  . LUMBAR LAMINECTOMY   HPI: 83 y.o.malewith medical history significant for CAD,  CVA, hypertension, hyperlipidemia, MI, diabetes, presents to the emergency department with complaint of slurred speech. Initial evaluation concerning for TIA/stroke and/or possible pneumonia. Pt with recent admission for PNA. His daughter says that he has mild dysphagia from prior CVAs but is on a regular diet and thin liquids with use of aspiration precautions.  Subjective: pt alert, dysarthric, denies difficulty swallowing Assessment / Plan / Recommendation CHL IP CLINICAL IMPRESSIONS 10/12/2016 Therapy Diagnosis Moderate pharyngeal phase dysphagia;Moderate oral phase dysphagia Clinical Impression Pt has a moderate oropharyngeal dysphagia, due to impaired timing and motor weakness. He has oral holding and decreased bolus cohesion, but responds to Mod cues to initiate posterior propulsion. His delayed timing and generalized pharyngeal weakness results in deep penetration to the vocal folds with spoonfuls of nectar thick liquids that elicits a weak cough, ineffective at clearing any penetrates. Even with cues for more force, he is unable to sufficiently clear penetrates. Moderate residue remains in the pharynx but primarily at the valleculae, and increases in volume as the boluses get thicker. SLP provided Mod cues for use of chin tuck to reduce residuals and better contain boluses in the valleculae to prevent further penetration from occuring. Recommend Dys 1 diet and honey thick liquids by spoon only with need for full supervision to ensure use of chin tuck. Impact on safety and function Mild aspiration risk;Moderate aspiration risk   CHL IP TREATMENT RECOMMENDATION 10/12/2016 Treatment Recommendations Therapy as outlined in treatment plan below   Prognosis 10/12/2016 Prognosis for Safe Diet Advancement Fair Barriers to Reach Goals Other (Comment) Barriers/Prognosis Comment -- [facility] IP DIET RECOMMENDATION 10/12/2016 SLP Diet Recommendations Dysphagia 1 (Puree) solids;Honey thick liquids Liquid Administration via Spoon  Medication Administration Crushed with puree Compensations Minimize environmental distractions;Slow rate;Small sips/bites;Follow solids with liquid;Chin tuck Postural Changes Seated upright at 90 degrees;Remain semi-upright after after feeds/meals (Comment)   CHL IP OTHER RECOMMENDATIONS 10/12/2016 Recommended Consults -- Oral Care Recommendations Oral care BID Other Recommendations Order thickener from pharmacy;Prohibited food (jello, ice cream, thin soups);Remove water pitcher;Have oral suction available   [facility] IP FOLLOW UP RECOMMENDATIONS 10/12/2016 Follow up Recommendations Skilled Nursing facility   [facility] IP FREQUENCY AND DURATION 10/12/2016 Speech Therapy Frequency (ACUTE ONLY) min 2x/week Treatment Duration 2 weeks      CHL IP ORAL PHASE 10/12/2016 Oral Phase Impaired Oral - Pudding Teaspoon -- Oral - Pudding Cup -- Oral - Honey Teaspoon Holding of bolus;Decreased bolus cohesion Oral - Honey Cup -- Oral - Nectar Teaspoon Holding of bolus;Decreased bolus cohesion Oral - Nectar Cup -- Oral - Nectar Straw -- Oral - Thin Teaspoon -- Oral - Thin Cup -- Oral - Thin Straw -- Oral - Puree Holding of bolus;Decreased bolus cohesion Oral - Mech Soft -- Oral - Regular -- Oral - Multi-Consistency -- Oral - Pill -- Oral Phase - Comment --  CHL IP PHARYNGEAL PHASE 10/12/2016 Pharyngeal Phase Impaired Pharyngeal- Pudding Teaspoon -- Pharyngeal -- Pharyngeal- Pudding Cup -- Pharyngeal -- Pharyngeal-  Honey Teaspoon Delayed swallow initiation-vallecula;Reduced anterior laryngeal mobility;Reduced laryngeal elevation;Reduced epiglottic inversion;Reduced tongue base retraction;Pharyngeal residue - valleculae;Compensatory strategies attempted (with notebox) Pharyngeal -- Pharyngeal- Honey Cup -- Pharyngeal -- Pharyngeal- Nectar Teaspoon Reduced anterior laryngeal mobility;Reduced laryngeal elevation;Reduced epiglottic inversion;Reduced tongue base retraction;Pharyngeal residue - valleculae;Delayed swallow initiation-pyriform  sinuses;Penetration/Aspiration before swallow Pharyngeal Material enters airway, CONTACTS cords and not ejected out Pharyngeal- Nectar Cup -- Pharyngeal -- Pharyngeal- Nectar Straw -- Pharyngeal -- Pharyngeal- Thin Teaspoon -- Pharyngeal -- Pharyngeal- Thin Cup -- Pharyngeal -- Pharyngeal- Thin Straw -- Pharyngeal -- Pharyngeal- Puree Delayed swallow initiation-vallecula;Reduced anterior laryngeal mobility;Reduced laryngeal elevation;Reduced epiglottic inversion;Reduced tongue base retraction;Pharyngeal residue - valleculae;Compensatory strategies attempted (with notebox) Pharyngeal -- Pharyngeal- Mechanical Soft -- Pharyngeal -- Pharyngeal- Regular -- Pharyngeal -- Pharyngeal- Multi-consistency -- Pharyngeal -- Pharyngeal- Pill -- Pharyngeal -- Pharyngeal Comment --  CHL IP CERVICAL ESOPHAGEAL PHASE 10/12/2016 Cervical Esophageal Phase WFL Pudding Teaspoon -- Pudding Cup -- Honey Teaspoon -- Honey Cup -- Nectar Teaspoon -- Nectar Cup -- Nectar Straw -- Thin Teaspoon -- Thin Cup -- Thin Straw -- Puree -- Mechanical Soft -- Regular -- Multi-consistency -- Pill -- Cervical Esophageal Comment -- No flowsheet data found. Germain Osgood 10/12/2016, 10:42 AM  Germain Osgood, M.A. CCC-SLP [phone]             Ct Head Code Stroke W/o Cm  Result Date: 10/10/2016 CLINICAL DATA:  Code stroke. Slurred speech and left-sided weakness. EXAM: CT HEAD WITHOUT CONTRAST TECHNIQUE: Contiguous axial images were obtained from the base of the skull through the vertex without intravenous contrast. COMPARISON:  07/27/2015 FINDINGS: Brain: No acute infarct is noted. There is asymmetric cortical atrophy and white matter gliosis in the right cerebral hemisphere that has progressed. Remote basal ganglia infarct on the right. Background of generalized atrophy, especially prominent in the posterior fossa. No hemorrhage or hydrocephalus.  Negative for mass. Vascular: Atherosclerotic calcifications. Possible hyperdense right M2 branch,  although this could also be atherosclerotic plaque. Skull: No acute finding Sinuses/Orbits: No acute finding Other: Page sent at the time of interpretation on 10/10/2016 at 11:09 am to Dr. Leonel Ramsay. ASPECTS [facility] Stroke Program Early CT Score) Not scored due to degree of chronic change IMPRESSION: 1. No acute hemorrhage or visible acute infarct. 2. Atherosclerotic calcification versus hyperdense right M2 branch. 3. Remote right basal ganglia infarct. Asymmetric atrophy and white matter gliosis in the right cerebral hemisphere which can indicate chronic ischemia and arterial stenosis. 4. Generalized atrophy, most prominent in the posterior fossa. Electronically Signed   By: Monte Fantasia M.D.   On: 10/10/2016 11:13    Microbiology: Recent Results (from the past 240 hour(s))  Blood culture (routine x 2)     Status: None   Collection Time: 10/04/16  1:06 PM  Result Value Ref Range Status   Specimen Description BLOOD L AC  Final   Special Requests   Final    BOTTLES DRAWN AEROBIC AND ANAEROBIC AER 13ML ANA 16ML   Culture NO GROWTH 5 DAYS  Final   Report Status 10/09/2016 FINAL  Final  Blood culture (routine x 2)     Status: None   Collection Time: 10/04/16  1:06 PM  Result Value Ref Range Status   Specimen Description BLOOD L FA  Final   Special Requests   Final    BOTTLES DRAWN AEROBIC AND ANAEROBIC AER 14ML ANA 15ML   Culture NO GROWTH 5 DAYS  Final   Report Status 10/09/2016 FINAL  Final  Blood Culture (routine x 2)  Status: None (Preliminary result)   Collection Time: 10/10/16  3:30 PM  Result Value Ref Range Status   Specimen Description BLOOD RIGHT ANTECUBITAL  Final   Special Requests BOTTLES DRAWN AEROBIC AND ANAEROBIC 5CC  Final   Culture NO GROWTH 2 DAYS  Final   Report Status PENDING  Incomplete  Blood Culture (routine x 2)     Status: None (Preliminary result)   Collection Time: 10/10/16  3:30 PM  Result Value Ref Range Status   Specimen Description BLOOD RIGHT  FOREARM  Final   Special Requests BOTTLES DRAWN AEROBIC AND ANAEROBIC 5CC  Final   Culture NO GROWTH 2 DAYS  Final   Report Status PENDING  Incomplete  MRSA PCR Screening     Status: None   Collection Time: 10/10/16 11:50 PM  Result Value Ref Range Status   MRSA by PCR NEGATIVE NEGATIVE Final    Comment:        The GeneXpert MRSA Assay (FDA approved for NASAL specimens only), is one component of a comprehensive MRSA colonization surveillance program. It is not intended to diagnose MRSA infection nor to guide or monitor treatment for MRSA infections.      Labs: CBC:  Recent Labs Lab 10/07/16 0451 10/10/16 1040 10/10/16 1050 10/11/16 0257 10/12/16 0632 10/13/16 0446  WBC 11.5* 19.4*  --  14.1* 16.1* 14.5*  NEUTROABS  --  15.7*  --   --  13.2* 12.1*  HGB 12.4* 12.5* 12.2* 11.2* 11.4* 11.3*  HCT 35.2* 37.0* 36.0* 34.1* 34.0* 34.6*  MCV 88.9 90.9  --  91.9 90.9 91.8  PLT 216 283  --  281 294 [id number]   Basic Metabolic Panel:  Recent Labs Lab 10/10/16 1040 10/10/16 1050 10/11/16 0257 10/11/16 2149 10/12/16 0632 10/13/16 0446  NA 142 142 144 146* 146* 146*  K 3.9 3.8 4.2 3.4* 3.5 3.6  CL 106 103 108 111 115* 115*  CO2 27  --  27 25 24 23   GLUCOSE 250* 247* 221* 86 77 188*  BUN 32* 32* 31* 29* 30* 31*  CREATININE 1.77* 1.70* 1.68* 1.54* 1.48* 1.59*  CALCIUM 8.7*  --  8.5* 8.4* 8.3* 8.5*  MG  --   --   --  2.5* 2.8* 2.8*   Liver Function Tests:  Recent Labs Lab 10/10/16 1040 10/12/16 0632 10/13/16 0446  AST 47* 39 35  ALT 45 38 35  ALKPHOS 67 80 84  BILITOT 1.1 0.9 0.7  PROT 6.8 6.4* 6.6  ALBUMIN 2.7* 2.3* 2.5*   No results for input(s): LIPASE, AMYLASE in the last 168 hours. No results for input(s): AMMONIA in the last 168 hours. Cardiac Enzymes:  Recent Labs Lab 10/10/16 1945 10/11/16 0257 10/11/16 2149  TROPONINI 0.51* 0.46* 0.30*   BNP (last 3 results) No results for input(s): BNP in the last 8760 hours. CBG:  Recent Labs Lab  10/12/16 1143 10/12/16 1645 10/12/16 2107 10/13/16 0623 10/13/16 1118  GLUCAP 117* 147* 193* 174* 245*   Time spent: 30 minutes  Signed:  Camy Leder  [facility]  10/13/2016  , 12:46 PM

## 2016-10-13 NOTE — Progress Notes (Signed)
Patient left unit with PTAR via stretcher with all belongings.  IV catheter removed without difficulty.  Telemetry removed and verified with ccmd.

## 2016-10-13 NOTE — Progress Notes (Signed)
Patient will discharge to [facility] Anticipated discharge date: 2/1 Family notified: Programmer, multimedia by Corey Harold- scheduled at 2:30pm Report #: [phone] Rm 59  CSW signing off.  Jorge Ny, LCSW Clinical Social Worker [phone]

## 2016-10-15 LAB — CULTURE, BLOOD (ROUTINE X 2)
CULTURE: NO GROWTH
Culture: NO GROWTH

## 2016-12-11 DEATH — deceased
